# Patient Record
Sex: Female | Born: 1985 | Race: Black or African American | Hispanic: No | Marital: Married | Smoking: Never smoker
Health system: Southern US, Community
[De-identification: ages and names within clinical notes are randomized; demographics above are authoritative.]

## PROBLEM LIST (undated history)

## (undated) ENCOUNTER — Inpatient Hospital Stay (HOSPITAL_COMMUNITY): Payer: Self-pay

## (undated) DIAGNOSIS — D649 Anemia, unspecified: Secondary | ICD-10-CM

## (undated) HISTORY — PX: TUBAL LIGATION: SHX77

## (undated) HISTORY — PX: APPENDECTOMY: SHX54

---

## 2010-11-11 ENCOUNTER — Emergency Department (HOSPITAL_COMMUNITY)
Admission: EM | Admit: 2010-11-11 | Discharge: 2010-11-11 | Payer: Self-pay | Source: Home / Self Care | Admitting: Emergency Medicine

## 2011-02-21 ENCOUNTER — Emergency Department (HOSPITAL_COMMUNITY)
Admission: EM | Admit: 2011-02-21 | Discharge: 2011-02-22 | Disposition: A | Discharge status: Home / Self Care | Payer: 59 | Attending: Emergency Medicine | Admitting: Emergency Medicine

## 2011-02-21 DIAGNOSIS — R51 Headache: Secondary | ICD-10-CM | POA: Insufficient documentation

## 2011-02-21 DIAGNOSIS — F0781 Postconcussional syndrome: Secondary | ICD-10-CM | POA: Insufficient documentation

## 2011-02-22 ENCOUNTER — Encounter (HOSPITAL_COMMUNITY): Payer: Self-pay | Admitting: Radiology

## 2011-02-22 ENCOUNTER — Emergency Department (HOSPITAL_COMMUNITY): Payer: 59

## 2011-06-02 ENCOUNTER — Other Ambulatory Visit (HOSPITAL_COMMUNITY)
Admission: RE | Admit: 2011-06-02 | Discharge: 2011-06-02 | Disposition: A | Discharge status: Home / Self Care | Payer: 59 | Source: Ambulatory Visit | Attending: Obstetrics and Gynecology | Admitting: Obstetrics and Gynecology

## 2011-06-02 DIAGNOSIS — Z01419 Encounter for gynecological examination (general) (routine) without abnormal findings: Secondary | ICD-10-CM | POA: Insufficient documentation

## 2011-06-02 DIAGNOSIS — Z113 Encounter for screening for infections with a predominantly sexual mode of transmission: Secondary | ICD-10-CM | POA: Insufficient documentation

## 2012-06-03 ENCOUNTER — Other Ambulatory Visit (HOSPITAL_COMMUNITY)
Admission: RE | Admit: 2012-06-03 | Discharge: 2012-06-03 | Disposition: A | Discharge status: Home / Self Care | Payer: 59 | Source: Ambulatory Visit | Attending: Obstetrics and Gynecology | Admitting: Obstetrics and Gynecology

## 2012-06-03 DIAGNOSIS — Z01419 Encounter for gynecological examination (general) (routine) without abnormal findings: Secondary | ICD-10-CM | POA: Insufficient documentation

## 2012-12-04 NOTE — L&D Delivery Note (Signed)
Delivery Note At 11:55 PM a viable female was delivered via Vaginal, Spontaneous Delivery (Presentation: Right Occiput Anterior).  APGAR: 8, 9; weight pending.   Placenta status: Intact, Spontaneous.  Cord: 3 vessels with the following complications: None.  Cord pH: n/a  Anesthesia: None  Episiotomy: Median, no extension Lacerations:  Suture Repair: 2.0 3.0 chromic Est. Blood Loss (mL): 350  Mom to postpartum.  Baby to skin to skin.  Geryl Rankins 09/03/2013, 12:51 AM

## 2013-01-17 IMAGING — CT CT HEAD W/O CM
1 of 2 series · 16 of 30 positions shown, 20 images · non-contrast
Comparison: None available

CLINICAL DATA: Fall.  Dizziness.  Pain behind eyes.

CT HEAD WITHOUT CONTRAST
TECHNIQUE: Contiguous axial images were obtained from the base of
the skull through the vertex without contrast.

[Series 4: headseq 2.4 h60s · axial · 0.43mm/px · z∈[-178,-55]mm · 16 of 60 slices shown, 20 images]
[im 4/60  brain]
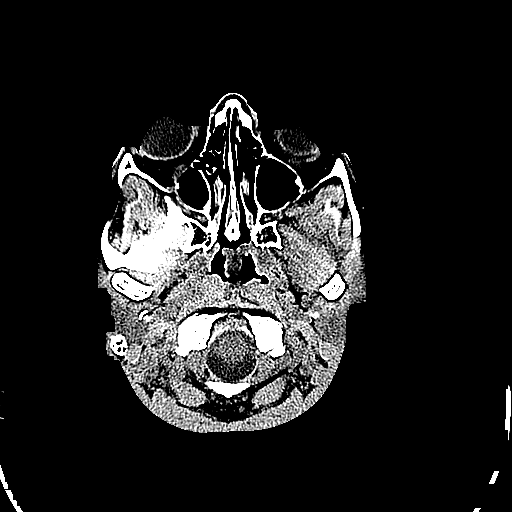
[im 4/60  bone]
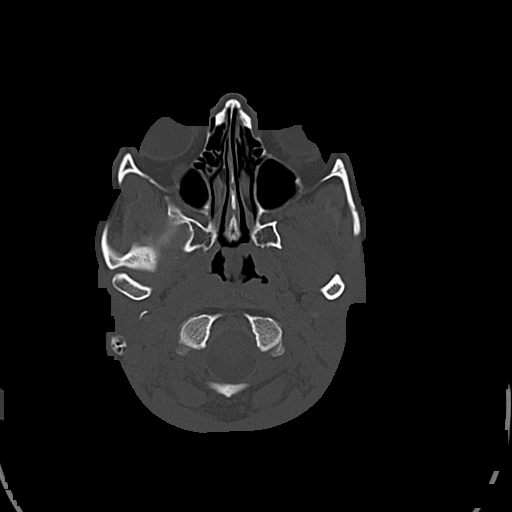
[im 7/60  brain]
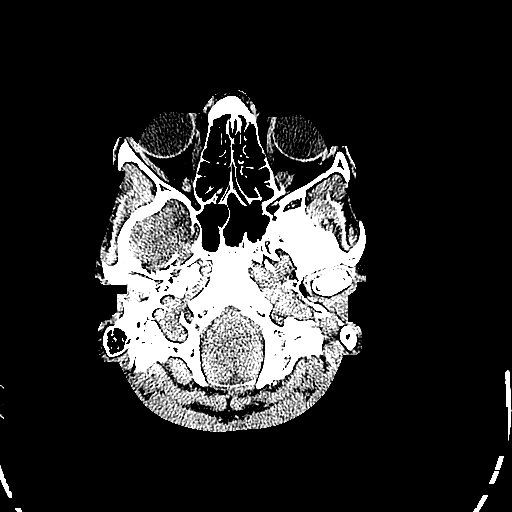
[im 10/60  brain]
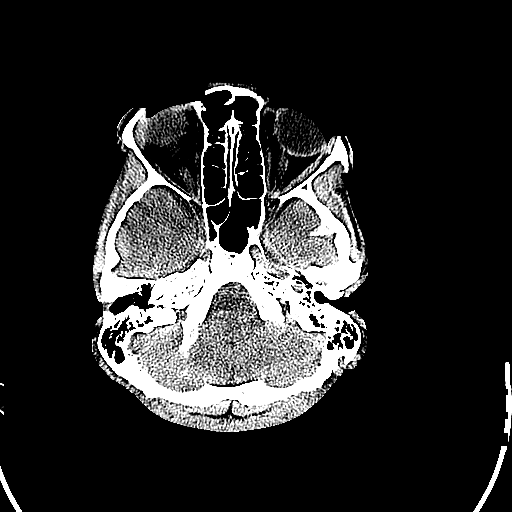
[im 13/60  brain]
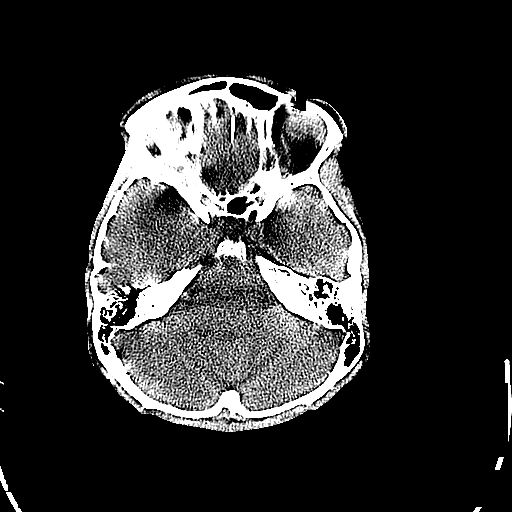
[im 19/60  brain]
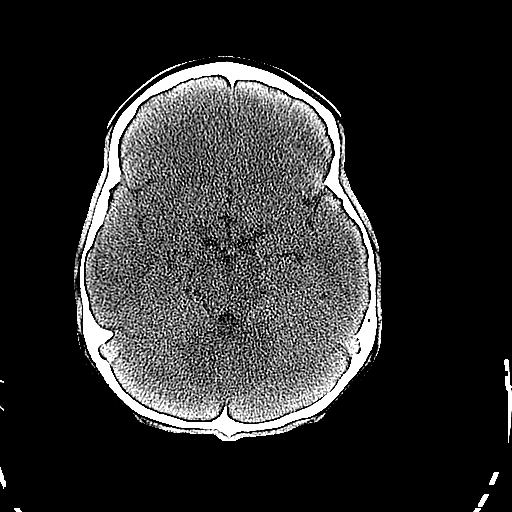
[im 19/60  bone]
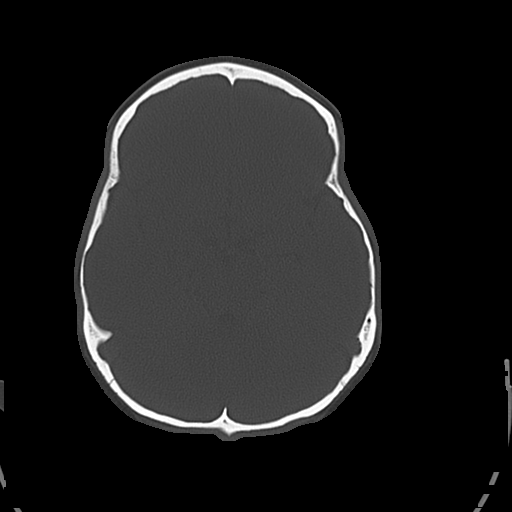
[im 22/60  brain]
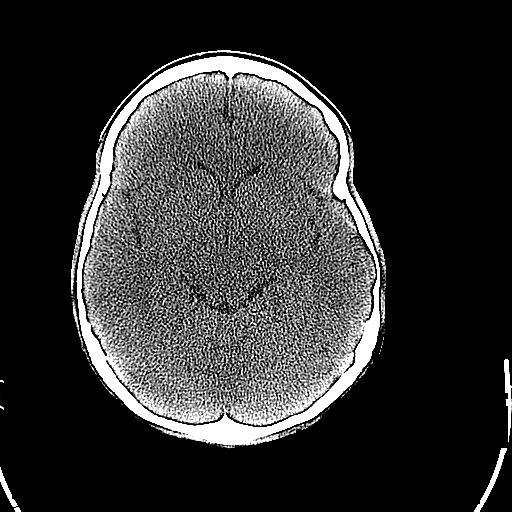
[im 25/60  brain]
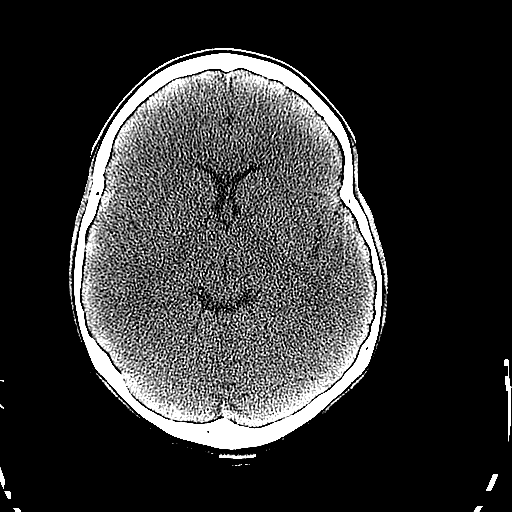
[im 28/60  brain]
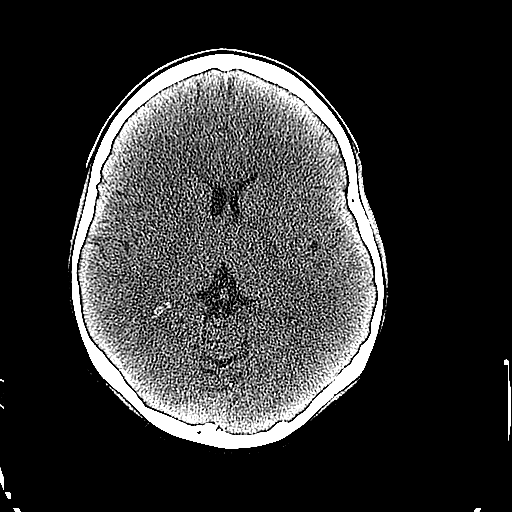
[im 32/60  brain]
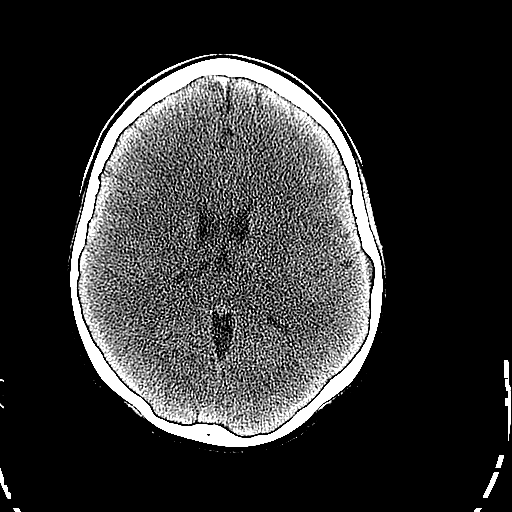
[im 32/60  bone]
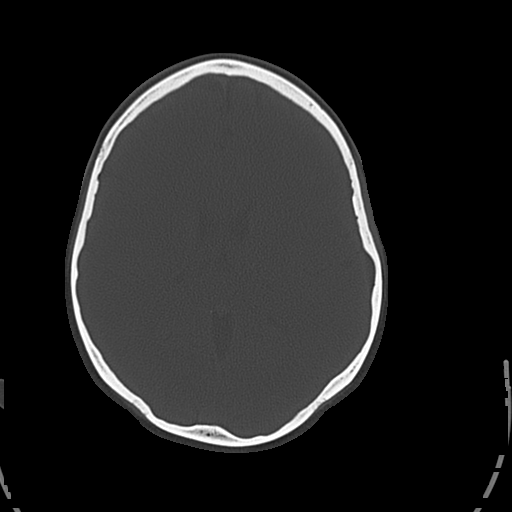
[im 35/60  brain]
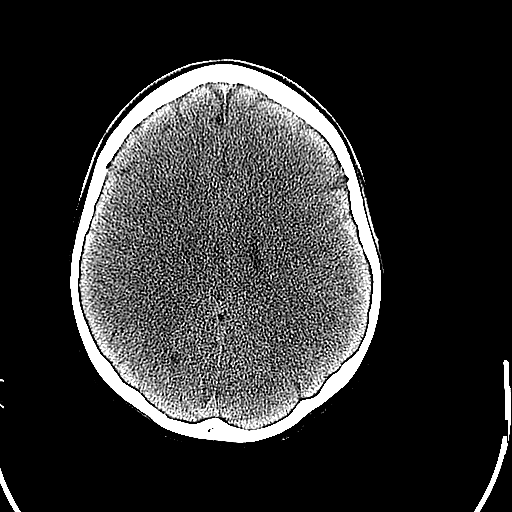
[im 38/60  brain]
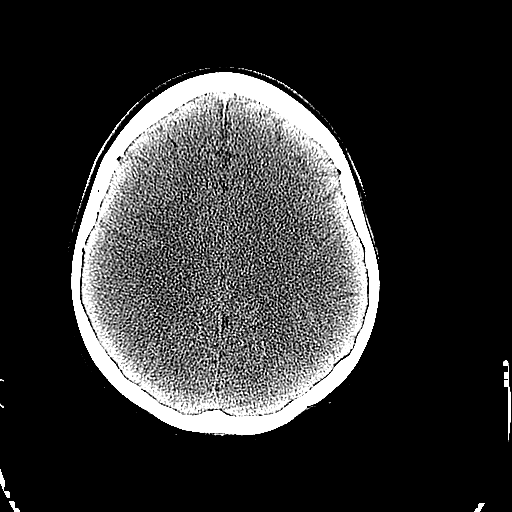
[im 41/60  brain]
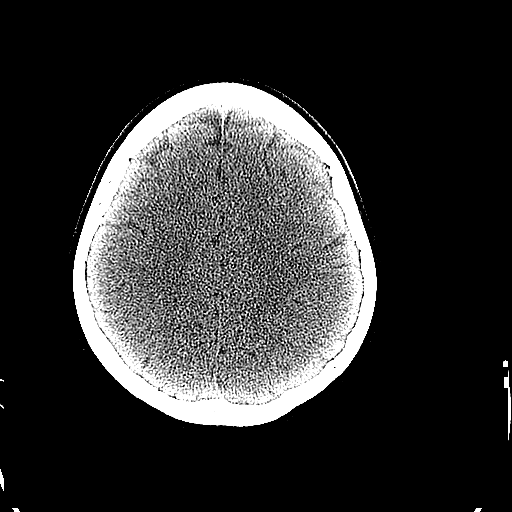
[im 47/60  brain]
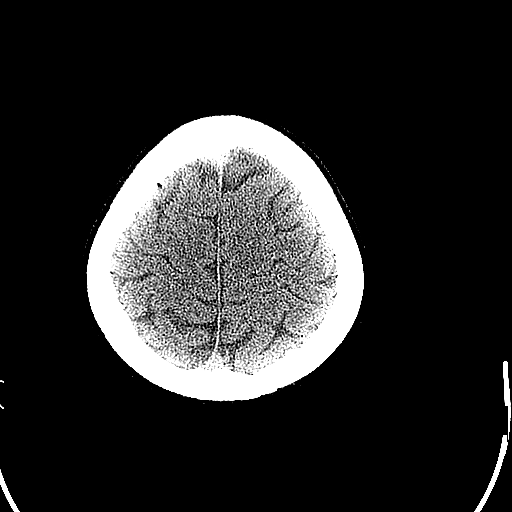
[im 47/60  bone]
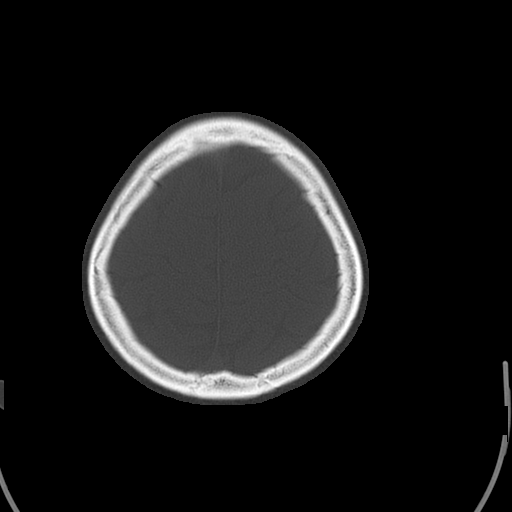
[im 50/60  brain]
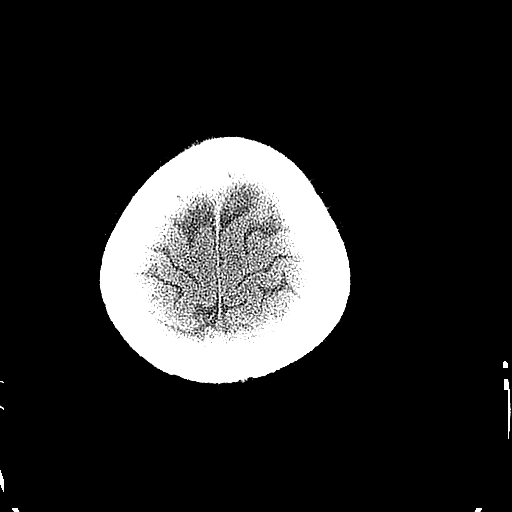
[im 53/60  brain]
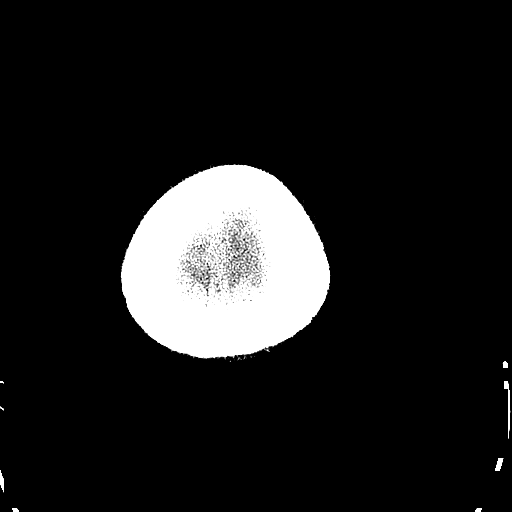
[im 56/60  brain]
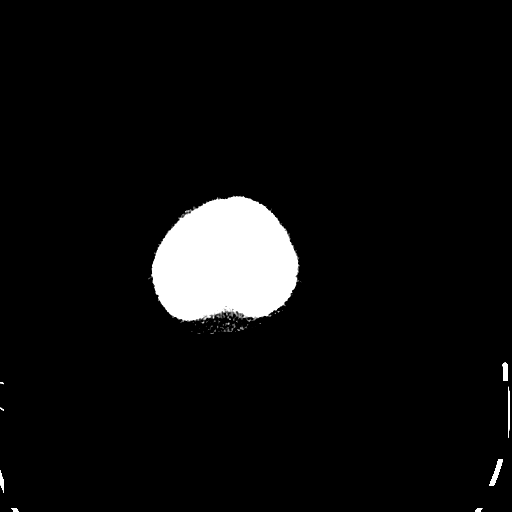

[16 of 30 positions shown; findings below may reference images not displayed]

FINDINGS: Bone windows demonstrate no significant soft tissue
swelling.  Clear paranasal sinuses and mastoid air cells.  No skull
fracture.

Soft tissue windows demonstrate no  mass lesion, hemorrhage,
hydrocephalus, acute infarct, intra-axial, or extra-axial fluid
collection.
IMPRESSION: Normal head CT.

## 2013-01-31 LAB — OB RESULTS CONSOLE HIV ANTIBODY (ROUTINE TESTING): HIV: NONREACTIVE

## 2013-01-31 LAB — OB RESULTS CONSOLE RUBELLA ANTIBODY, IGM: Rubella: IMMUNE

## 2013-01-31 LAB — OB RESULTS CONSOLE HEPATITIS B SURFACE ANTIGEN: Hepatitis B Surface Ag: NEGATIVE

## 2013-01-31 LAB — OB RESULTS CONSOLE ABO/RH: RH Type: POSITIVE

## 2013-01-31 LAB — OB RESULTS CONSOLE RPR: RPR: NONREACTIVE

## 2013-01-31 LAB — OB RESULTS CONSOLE ANTIBODY SCREEN: Antibody Screen: NEGATIVE

## 2013-02-06 LAB — OB RESULTS CONSOLE GC/CHLAMYDIA
Chlamydia: NEGATIVE
Gonorrhea: NEGATIVE

## 2013-08-07 LAB — OB RESULTS CONSOLE GBS: GBS: NEGATIVE

## 2013-09-02 ENCOUNTER — Inpatient Hospital Stay (HOSPITAL_COMMUNITY)
Admission: AD | Admit: 2013-09-02 | Discharge: 2013-09-04 | DRG: 775 | Disposition: A | Discharge status: Home / Self Care | Payer: Medicaid Other | Source: Ambulatory Visit | Attending: Obstetrics and Gynecology | Admitting: Obstetrics and Gynecology

## 2013-09-02 ENCOUNTER — Encounter (HOSPITAL_COMMUNITY): Payer: Self-pay | Admitting: *Deleted

## 2013-09-02 LAB — CBC
HCT: 41.3 % (ref 36.0–46.0)
Hemoglobin: 14.4 g/dL (ref 12.0–15.0)
MCH: 31.4 pg (ref 26.0–34.0)
MCHC: 34.9 g/dL (ref 30.0–36.0)
MCV: 90.2 fL (ref 78.0–100.0)
Platelets: 220 10*3/uL (ref 150–400)
RBC: 4.58 MIL/uL (ref 3.87–5.11)
RDW: 13 % (ref 11.5–15.5)
WBC: 17.1 10*3/uL — ABNORMAL HIGH (ref 4.0–10.5)

## 2013-09-02 MED ORDER — OXYCODONE-ACETAMINOPHEN 5-325 MG PO TABS: 1.0000 | ORAL_TABLET | ORAL | Status: DC | PRN

## 2013-09-02 MED ORDER — OXYTOCIN BOLUS FROM INFUSION
500.0000 mL | INTRAVENOUS | Status: DC
  Administered 2013-09-03: 500 mL via INTRAVENOUS

## 2013-09-02 MED ORDER — CITRIC ACID-SODIUM CITRATE 334-500 MG/5ML PO SOLN: 30.0000 mL | ORAL | Status: DC | PRN

## 2013-09-02 MED ORDER — ONDANSETRON HCL 4 MG/2ML IJ SOLN: 4.0000 mg | Freq: Four times a day (QID) | INTRAMUSCULAR | Status: DC | PRN

## 2013-09-02 MED ORDER — OXYTOCIN 40 UNITS IN LACTATED RINGERS INFUSION - SIMPLE MED
62.5000 mL/h | INTRAVENOUS | Status: DC
  Filled 2013-09-02: qty 1000

## 2013-09-02 MED ORDER — LIDOCAINE HCL (PF) 1 % IJ SOLN
30.0000 mL | INTRAMUSCULAR | Status: DC | PRN
  Administered 2013-09-03: 30 mL via SUBCUTANEOUS
  Filled 2013-09-02 (×2): qty 30

## 2013-09-02 MED ORDER — IBUPROFEN 600 MG PO TABS: 600.0000 mg | ORAL_TABLET | Freq: Four times a day (QID) | ORAL | Status: DC | PRN

## 2013-09-02 MED ORDER — ACETAMINOPHEN 325 MG PO TABS: 650.0000 mg | ORAL_TABLET | ORAL | Status: DC | PRN

## 2013-09-02 MED ORDER — LACTATED RINGERS IV SOLN: INTRAVENOUS | Status: DC

## 2013-09-02 MED ORDER — FLEET ENEMA 7-19 GM/118ML RE ENEM: 1.0000 | ENEMA | RECTAL | Status: DC | PRN

## 2013-09-02 MED ORDER — LACTATED RINGERS IV SOLN: 500.0000 mL | INTRAVENOUS | Status: DC | PRN

## 2013-09-02 NOTE — MAU Note (Signed)
Contractions, denies bleeding or ROM 

## 2013-09-02 NOTE — MAU Note (Signed)
Pt states contractions have become more intense since last night when they started

## 2013-09-03 ENCOUNTER — Encounter (HOSPITAL_COMMUNITY): Payer: Self-pay | Admitting: General Practice

## 2013-09-03 LAB — RPR: RPR Ser Ql: NONREACTIVE

## 2013-09-03 LAB — CBC
HCT: 38.6 % (ref 36.0–46.0)
Hemoglobin: 13.4 g/dL (ref 12.0–15.0)
MCH: 31 pg (ref 26.0–34.0)
MCHC: 34.7 g/dL (ref 30.0–36.0)
MCV: 89.4 fL (ref 78.0–100.0)
Platelets: 208 10*3/uL (ref 150–400)
RBC: 4.32 MIL/uL (ref 3.87–5.11)
RDW: 12.7 % (ref 11.5–15.5)
WBC: 22.5 10*3/uL — ABNORMAL HIGH (ref 4.0–10.5)

## 2013-09-03 LAB — ABO/RH: ABO/RH(D): O POS

## 2013-09-03 MED ORDER — MAGNESIUM HYDROXIDE 400 MG/5ML PO SUSP: 30.0000 mL | ORAL | Status: DC | PRN

## 2013-09-03 MED ORDER — LANOLIN HYDROUS EX OINT: TOPICAL_OINTMENT | CUTANEOUS | Status: DC | PRN

## 2013-09-03 MED ORDER — IBUPROFEN 600 MG PO TABS
600.0000 mg | ORAL_TABLET | Freq: Four times a day (QID) | ORAL | Status: DC
  Administered 2013-09-03 – 2013-09-04 (×5): 600 mg via ORAL
  Filled 2013-09-03 (×5): qty 1

## 2013-09-03 MED ORDER — METHYLERGONOVINE MALEATE 0.2 MG/ML IJ SOLN: 0.2000 mg | INTRAMUSCULAR | Status: DC | PRN

## 2013-09-03 MED ORDER — DIPHENHYDRAMINE HCL 25 MG PO CAPS: 25.0000 mg | ORAL_CAPSULE | Freq: Four times a day (QID) | ORAL | Status: DC | PRN

## 2013-09-03 MED ORDER — BENZOCAINE-MENTHOL 20-0.5 % EX AERO
1.0000 "application " | INHALATION_SPRAY | CUTANEOUS | Status: DC | PRN
  Administered 2013-09-03: 1 via TOPICAL
  Filled 2013-09-03: qty 56

## 2013-09-03 MED ORDER — ONDANSETRON HCL 4 MG PO TABS: 4.0000 mg | ORAL_TABLET | ORAL | Status: DC | PRN

## 2013-09-03 MED ORDER — METHYLERGONOVINE MALEATE 0.2 MG PO TABS: 0.2000 mg | ORAL_TABLET | ORAL | Status: DC | PRN

## 2013-09-03 MED ORDER — ZOLPIDEM TARTRATE 5 MG PO TABS: 5.0000 mg | ORAL_TABLET | Freq: Every evening | ORAL | Status: DC | PRN

## 2013-09-03 MED ORDER — SIMETHICONE 80 MG PO CHEW: 80.0000 mg | CHEWABLE_TABLET | ORAL | Status: DC | PRN

## 2013-09-03 MED ORDER — DIBUCAINE 1 % RE OINT: 1.0000 "application " | TOPICAL_OINTMENT | RECTAL | Status: DC | PRN

## 2013-09-03 MED ORDER — OXYTOCIN 40 UNITS IN LACTATED RINGERS INFUSION - SIMPLE MED: 62.5000 mL/h | INTRAVENOUS | Status: DC | PRN

## 2013-09-03 MED ORDER — SENNOSIDES-DOCUSATE SODIUM 8.6-50 MG PO TABS
2.0000 | ORAL_TABLET | ORAL | Status: DC
  Administered 2013-09-04: 2 via ORAL

## 2013-09-03 MED ORDER — PRENATAL MULTIVITAMIN CH
1.0000 | ORAL_TABLET | Freq: Every day | ORAL | Status: DC
  Administered 2013-09-03: 1 via ORAL
  Filled 2013-09-03: qty 1

## 2013-09-03 MED ORDER — TETANUS-DIPHTH-ACELL PERTUSSIS 5-2.5-18.5 LF-MCG/0.5 IM SUSP: 0.5000 mL | Freq: Once | INTRAMUSCULAR | Status: DC

## 2013-09-03 MED ORDER — OXYCODONE-ACETAMINOPHEN 5-325 MG PO TABS: 1.0000 | ORAL_TABLET | ORAL | Status: DC | PRN

## 2013-09-03 MED ORDER — INFLUENZA VAC SPLIT QUAD 0.5 ML IM SUSP
0.5000 mL | INTRAMUSCULAR | Status: AC
  Administered 2013-09-04: 0.5 mL via INTRAMUSCULAR
  Filled 2013-09-03: qty 0.5

## 2013-09-03 MED ORDER — MEASLES, MUMPS & RUBELLA VAC ~~LOC~~ INJ
0.5000 mL | INJECTION | Freq: Once | SUBCUTANEOUS | Status: DC
  Filled 2013-09-03: qty 0.5

## 2013-09-03 MED ORDER — ONDANSETRON HCL 4 MG/2ML IJ SOLN: 4.0000 mg | INTRAMUSCULAR | Status: DC | PRN

## 2013-09-03 MED ORDER — WITCH HAZEL-GLYCERIN EX PADS: 1.0000 "application " | MEDICATED_PAD | CUTANEOUS | Status: DC | PRN

## 2013-09-03 NOTE — Progress Notes (Signed)
UR chart review completed.  

## 2013-09-03 NOTE — Progress Notes (Signed)
Post Partum Day 1 s/p vaginal delivery  Subjective: no complaints, up ad lib, voiding and tolerating PO  Objective: Blood pressure 110/68, pulse 94, temperature 98.9 F (37.2 C), temperature source Oral, resp. rate 18, height 5' (1.524 m), weight 63.05 kg (139 lb), SpO2 97.00%, unknown if currently breastfeeding.  Physical Exam:  General: alert and cooperative Lochia: appropriate Uterine Fundus: firm Incision: NA DVT Evaluation: No evidence of DVT seen on physical exam.   Recent Labs  09/02/13 2230 09/03/13 0605  HGB 14.4 13.4  HCT 41.3 38.6    Assessment/Plan: Plan for discharge tomorrow and Breastfeeding   LOS: 1 day   Katrina [name] J. 09/03/2013, 7:38 PM

## 2013-09-03 NOTE — H&P (Signed)
Katrina [name] is a 27 y.o. female G1 at 68 1/7 weeks presenting for contractions. Contractions become every 5 minutes and very intense ~ 2-3 hours prior to admission.  Denies LOF, vaginal bleeding.  Per RN, pt had SROM after presenting to L&D. Prenatal care was  initiated at 10 weeks with Dr. Gerald [name].  It has been uncomplicated.  Maternal Medical History:  Reason for admission: Contractions.   Contractions: Onset was 13-24 hours ago.   Frequency: regular.   Perceived severity is strong.    Fetal activity: Perceived fetal activity is normal.    Prenatal complications: no prenatal complications Prenatal Complications - Diabetes: none.    OB History   Grav Para Term Preterm Abortions TAB SAB Ect Mult Living   1 1 1       1      Past Medical History  Diagnosis Date  . Medical history non-contributory    Past Surgical History  Procedure Laterality Date  . Appendectomy     Family History: family history includes Cancer in her maternal grandfather. There is no history of Alcohol abuse, Arthritis, Asthma, Birth defects, Depression, COPD, Diabetes, Drug abuse, Early death, Hearing loss, Heart disease, Hyperlipidemia, Hypertension, Kidney disease, Learning disabilities, Mental illness, Mental retardation, Miscarriages / Stillbirths, Stroke, or Vision loss. Social History:  reports that she has never smoked. She does not have any smokeless tobacco history on file. She reports that she does not drink alcohol or use illicit drugs.   Review of Systems  Gastrointestinal: Positive for abdominal pain.    Dilation: Lip/rim Effacement (%): 100 Station: +1 Exam by:: Katrina [name] pressure 110/68, pulse 94, temperature 98.9 F (37.2 C), temperature source Oral, resp. rate 18, height 5' (1.524 m), weight 63.05 kg (139 lb), SpO2 97.00%, unknown if currently breastfeeding. Maternal Exam:  Uterine Assessment: Contraction strength is firm.  Contraction frequency is regular.   Abdomen:  Estimated fetal weight is 6 lbs.   Fetal presentation: vertex  Introitus: Normal vulva. Amniotic fluid character: clear.  Pelvis: adequate for delivery.   Cervix: Cervix evaluated by digital exam.     Fetal Exam Fetal Monitor Review: Mode: fetoscope.   Variability: moderate (6-25 bpm).   Pattern: variable decelerations.    Fetal State Assessment: Category II - tracings are indeterminate.     Physical Exam  Constitutional: She is oriented to person, place, and time. She appears well-developed and well-nourished. She appears distressed.  HENT:  Head: Normocephalic and atraumatic.  Eyes: EOM are normal.  Neck: Normal range of motion.  Respiratory: She is in respiratory distress.  GI: There is no tenderness.  Genitourinary: Vagina normal.  Musculoskeletal: Normal range of motion.  Neurological: She is alert and oriented to person, place, and time.  Skin: Skin is warm and dry.  Psychiatric: She has a normal mood and affect.    Prenatal labs: ABO, Rh: --/--/O POS (09/30 2230) Antibody: Negative (02/28 0000) Rubella: Immune (02/28 0000) RPR: NON REACTIVE (09/30 2230)  HBsAg: Negative (02/28 0000)  HIV: Non-reactive (02/28 0000)  GBS: Negative (09/04 0000)   Assessment/Plan: IUP at 40+ weeks, active labor, s/p SROM Anticipate vaginal delivery soon. Labor support without epidural or pain meds.  Katrina [name] 09/03/2013, 9:41 PM

## 2013-09-04 MED ORDER — IBUPROFEN 600 MG PO TABS: 600.0000 mg | ORAL_TABLET | Freq: Four times a day (QID) | ORAL | Status: DC | PRN

## 2013-09-04 NOTE — Discharge Summary (Signed)
Obstetric Discharge Summary Reason for Admission: onset of labor Prenatal Procedures: none Intrapartum Procedures: episiotomy mediolateral  Postpartum Procedures: none Complications-Operative and Postpartum: none Hemoglobin  Date Value Range Status  09/03/2013 13.4  12.0 - 15.0 g/dL Final     HCT  Date Value Range Status  09/03/2013 38.6  36.0 - 46.0 % Final    Physical Exam:  General: alert and cooperative Lochia: appropriate Uterine Fundus: firm Incision: NA DVT Evaluation: No evidence of DVT seen on physical exam.  Discharge Diagnoses: Term Pregnancy-delivered  Discharge Information: Date: 09/04/2013 Activity: pelvic rest Diet: routine Medications: PNV and Ibuprofen Condition: stable Instructions: refer to practice specific booklet Discharge to: home Follow-up Information   Follow up with Jessee Avers., MD. Schedule an appointment as soon as possible for a visit in 6 weeks.   Specialty:  Obstetrics and Gynecology   Contact information:   [address] [place] [address] [phone]       Newborn Data: Live born female  Birth Weight: 7 lb 2.2 oz (3238 g) APGAR: 8, 9  Home with mother.  Verlyn Lambert J. 09/04/2013, 8:03 AM

## 2013-09-09 ENCOUNTER — Inpatient Hospital Stay (HOSPITAL_COMMUNITY): Admission: RE | Admit: 2013-09-09 | Payer: 59 | Source: Ambulatory Visit

## 2013-11-24 ENCOUNTER — Other Ambulatory Visit: Payer: Self-pay | Admitting: Obstetrics and Gynecology

## 2013-11-24 ENCOUNTER — Other Ambulatory Visit (HOSPITAL_COMMUNITY)
Admission: RE | Admit: 2013-11-24 | Discharge: 2013-11-24 | Disposition: A | Discharge status: Home / Self Care | Payer: BC Managed Care – PPO | Source: Ambulatory Visit | Attending: Obstetrics and Gynecology | Admitting: Obstetrics and Gynecology

## 2013-11-24 DIAGNOSIS — Z01419 Encounter for gynecological examination (general) (routine) without abnormal findings: Secondary | ICD-10-CM | POA: Insufficient documentation

## 2014-09-09 LAB — OB RESULTS CONSOLE HIV ANTIBODY (ROUTINE TESTING): HIV: NONREACTIVE

## 2014-09-09 LAB — OB RESULTS CONSOLE GC/CHLAMYDIA
Chlamydia: NEGATIVE
Gonorrhea: NEGATIVE

## 2014-09-09 LAB — OB RESULTS CONSOLE ABO/RH: RH Type: POSITIVE

## 2014-09-09 LAB — OB RESULTS CONSOLE HEPATITIS B SURFACE ANTIGEN: Hepatitis B Surface Ag: NEGATIVE

## 2014-09-09 LAB — OB RESULTS CONSOLE ANTIBODY SCREEN: Antibody Screen: NEGATIVE

## 2014-09-09 LAB — OB RESULTS CONSOLE RUBELLA ANTIBODY, IGM: Rubella: IMMUNE

## 2014-09-09 LAB — OB RESULTS CONSOLE RPR: RPR: NONREACTIVE

## 2014-10-05 ENCOUNTER — Encounter (HOSPITAL_COMMUNITY): Payer: Self-pay | Admitting: General Practice

## 2014-12-04 NOTE — L&D Delivery Note (Addendum)
Delivery Note At 7:39 AM a viable female, "Katrina [name], Jr.", was delivered via Vaginal, Spontaneous Delivery (Presentation: ; Occiput Anterior).  APGAR: , ; weight  .   Placenta status: Intact, Spontaneous.  Cord:  with the following complications: None.  Cord pH: NA  Anesthesia: None  Episiotomy: None Lacerations:  None Suture Repair: NA Est. Blood Loss (mL):  250  Mom to postpartum.  Baby to Couplet care / Skin to Skin. Family plans inpatient circumcision.  Will inform [facility] MDs of request. Per consult with Dr. Estanislado Pandyivard, will defer circumcision to Dr. Richardson Doppole on Monday.  [name] 03/20/2015, 8:01 AM

## 2015-03-20 ENCOUNTER — Inpatient Hospital Stay (HOSPITAL_COMMUNITY)
Admission: AD | Admit: 2015-03-20 | Discharge: 2015-03-22 | DRG: 775 | Disposition: A | Discharge status: Home / Self Care | Payer: Medicaid Other | Source: Ambulatory Visit | Attending: Obstetrics and Gynecology | Admitting: Obstetrics and Gynecology

## 2015-03-20 ENCOUNTER — Telehealth: Payer: Self-pay

## 2015-03-20 ENCOUNTER — Encounter (HOSPITAL_COMMUNITY): Payer: Self-pay

## 2015-03-20 DIAGNOSIS — O4292 Full-term premature rupture of membranes, unspecified as to length of time between rupture and onset of labor: Secondary | ICD-10-CM | POA: Diagnosis present

## 2015-03-20 DIAGNOSIS — Z3A38 38 weeks gestation of pregnancy: Secondary | ICD-10-CM | POA: Diagnosis present

## 2015-03-20 LAB — CBC
HCT: 34.2 % — ABNORMAL LOW (ref 36.0–46.0)
Hemoglobin: 11.7 g/dL — ABNORMAL LOW (ref 12.0–15.0)
MCH: 29.9 pg (ref 26.0–34.0)
MCHC: 34.2 g/dL (ref 30.0–36.0)
MCV: 87.5 fL (ref 78.0–100.0)
Platelets: 158 10*3/uL (ref 150–400)
RBC: 3.91 MIL/uL (ref 3.87–5.11)
RDW: 12.9 % (ref 11.5–15.5)
WBC: 9.6 10*3/uL (ref 4.0–10.5)

## 2015-03-20 LAB — TYPE AND SCREEN
ABO/RH(D): O POS
Antibody Screen: NEGATIVE

## 2015-03-20 LAB — RPR: RPR Ser Ql: NONREACTIVE

## 2015-03-20 LAB — AMNISURE RUPTURE OF MEMBRANE (ROM) NOT AT ARMC: Amnisure ROM: POSITIVE

## 2015-03-20 LAB — HIV ANTIBODY (ROUTINE TESTING W REFLEX): HIV Screen 4th Generation wRfx: NONREACTIVE

## 2015-03-20 MED ORDER — BENZOCAINE-MENTHOL 20-0.5 % EX AERO
1.0000 "application " | INHALATION_SPRAY | CUTANEOUS | Status: DC | PRN
  Filled 2015-03-20: qty 56

## 2015-03-20 MED ORDER — ACETAMINOPHEN 325 MG PO TABS: 650.0000 mg | ORAL_TABLET | ORAL | Status: DC | PRN

## 2015-03-20 MED ORDER — OXYTOCIN 40 UNITS IN LACTATED RINGERS INFUSION - SIMPLE MED
62.5000 mL/h | INTRAVENOUS | Status: DC
  Filled 2015-03-20: qty 1000

## 2015-03-20 MED ORDER — NALBUPHINE HCL 10 MG/ML IJ SOLN
10.0000 mg | INTRAMUSCULAR | Status: DC | PRN
  Administered 2015-03-20: 10 mg via INTRAVENOUS
  Filled 2015-03-20: qty 1

## 2015-03-20 MED ORDER — OXYCODONE-ACETAMINOPHEN 5-325 MG PO TABS: 2.0000 | ORAL_TABLET | ORAL | Status: DC | PRN

## 2015-03-20 MED ORDER — LANOLIN HYDROUS EX OINT: TOPICAL_OINTMENT | CUTANEOUS | Status: DC | PRN

## 2015-03-20 MED ORDER — NALBUPHINE HCL 10 MG/ML IJ SOLN: 10.0000 mg | Freq: Once | INTRAMUSCULAR | Status: DC

## 2015-03-20 MED ORDER — CITRIC ACID-SODIUM CITRATE 334-500 MG/5ML PO SOLN: 30.0000 mL | ORAL | Status: DC | PRN

## 2015-03-20 MED ORDER — SODIUM CHLORIDE 0.9 % IJ SOLN: 3.0000 mL | INTRAMUSCULAR | Status: DC | PRN

## 2015-03-20 MED ORDER — TETANUS-DIPHTH-ACELL PERTUSSIS 5-2.5-18.5 LF-MCG/0.5 IM SUSP: 0.5000 mL | Freq: Once | INTRAMUSCULAR | Status: DC

## 2015-03-20 MED ORDER — SENNOSIDES-DOCUSATE SODIUM 8.6-50 MG PO TABS
2.0000 | ORAL_TABLET | ORAL | Status: DC
Start: 2015-03-21 — End: 2015-03-22
  Administered 2015-03-20: 2 via ORAL
  Filled 2015-03-20 (×2): qty 2

## 2015-03-20 MED ORDER — OXYCODONE-ACETAMINOPHEN 5-325 MG PO TABS: 1.0000 | ORAL_TABLET | ORAL | Status: DC | PRN

## 2015-03-20 MED ORDER — WITCH HAZEL-GLYCERIN EX PADS: 1.0000 "application " | MEDICATED_PAD | CUTANEOUS | Status: DC | PRN

## 2015-03-20 MED ORDER — LACTATED RINGERS IV SOLN: 500.0000 mL | INTRAVENOUS | Status: DC | PRN

## 2015-03-20 MED ORDER — DIBUCAINE 1 % RE OINT
1.0000 "application " | TOPICAL_OINTMENT | RECTAL | Status: DC | PRN
  Filled 2015-03-20: qty 28

## 2015-03-20 MED ORDER — LIDOCAINE HCL (PF) 1 % IJ SOLN
30.0000 mL | INTRAMUSCULAR | Status: DC | PRN
  Filled 2015-03-20: qty 30

## 2015-03-20 MED ORDER — ONDANSETRON HCL 4 MG PO TABS: 4.0000 mg | ORAL_TABLET | ORAL | Status: DC | PRN

## 2015-03-20 MED ORDER — ONDANSETRON HCL 4 MG/2ML IJ SOLN: 4.0000 mg | Freq: Four times a day (QID) | INTRAMUSCULAR | Status: DC | PRN

## 2015-03-20 MED ORDER — ZOLPIDEM TARTRATE 5 MG PO TABS
5.0000 mg | ORAL_TABLET | Freq: Every evening | ORAL | Status: DC | PRN
Start: 2015-03-20 — End: 2015-03-22

## 2015-03-20 MED ORDER — SODIUM CHLORIDE 0.9 % IV SOLN
250.0000 mL | INTRAVENOUS | Status: DC | PRN
Start: 2015-03-20 — End: 2015-03-20

## 2015-03-20 MED ORDER — DIPHENHYDRAMINE HCL 25 MG PO CAPS: 25.0000 mg | ORAL_CAPSULE | Freq: Four times a day (QID) | ORAL | Status: DC | PRN

## 2015-03-20 MED ORDER — ONDANSETRON HCL 4 MG/2ML IJ SOLN
4.0000 mg | INTRAMUSCULAR | Status: DC | PRN
Start: 2015-03-20 — End: 2015-03-22

## 2015-03-20 MED ORDER — IBUPROFEN 600 MG PO TABS
600.0000 mg | ORAL_TABLET | Freq: Four times a day (QID) | ORAL | Status: DC
  Administered 2015-03-20 – 2015-03-22 (×9): 600 mg via ORAL
  Filled 2015-03-20 (×9): qty 1

## 2015-03-20 MED ORDER — SIMETHICONE 80 MG PO CHEW: 80.0000 mg | CHEWABLE_TABLET | ORAL | Status: DC | PRN

## 2015-03-20 MED ORDER — PRENATAL MULTIVITAMIN CH
1.0000 | ORAL_TABLET | Freq: Every day | ORAL | Status: DC
  Administered 2015-03-20 – 2015-03-22 (×3): 1 via ORAL
  Filled 2015-03-20 (×3): qty 1

## 2015-03-20 MED ORDER — OXYTOCIN BOLUS FROM INFUSION
500.0000 mL | INTRAVENOUS | Status: DC
  Administered 2015-03-20: 500 mL via INTRAVENOUS

## 2015-03-20 NOTE — H&P (Signed)
Katrina [name] is a 29 y.o. female presenting for leakage of fluid and contractions.  Patient reports that she had gush of fluid this morning and then started having contractions.  Patient reports good fetal movement and no bloody show.  Patient is GBS negative and desires a non-medicated delivery.    Maternal Medical History:  Reason for admission: Rupture of membranes.   Contractions: Onset was 3-5 hours ago.   Frequency: regular.   Perceived severity is moderate.    Fetal activity: Perceived fetal activity is normal.   Last perceived fetal movement was within the past hour.    Prenatal complications: no prenatal complications Prenatal Complications - Diabetes: none.    OB History    Gravida Para Term Preterm AB TAB SAB Ectopic Multiple Living   2 1 1       1      Past Medical History  Diagnosis Date  . Medical history non-contributory    Past Surgical History  Procedure Laterality Date  . Appendectomy     Family History: family history includes Cancer in her maternal grandfather. There is no history of Alcohol abuse, Arthritis, Asthma, Birth defects, Depression, COPD, Diabetes, Drug abuse, Early death, Hearing loss, Heart disease, Hyperlipidemia, Hypertension, Kidney disease, Learning disabilities, Mental illness, Mental retardation, Miscarriages / Stillbirths, Stroke, or Vision loss. Social History:  reports that she has never smoked. She has never used smokeless tobacco. She reports that she does not drink alcohol or use illicit drugs.   Prenatal Transfer Tool  Maternal Diabetes: No Genetic Screening: Declined Maternal Ultrasounds/Referrals: Normal Fetal Ultrasounds or other Referrals:  None Maternal Substance Abuse:  No Significant Maternal Medications:  None Significant Maternal Lab Results:  Lab values include: Group B Strep negative Other Comments:  None  Review of Systems  All other systems reviewed and are negative.   Dilation: 4 Effacement (%):  90 Station: 0 Exam by:: Katrina [name], rnc Blood pressure 113/72, pulse 93, temperature 98.4 F (36.9 C), temperature source Oral, resp. rate 20, unknown if currently breastfeeding. Maternal Exam:  Uterine Assessment: Contraction strength is moderate.  Abdomen: Patient reports no abdominal tenderness. Fundal height is Appropriate Gestational Age.   Estimated fetal weight is 6 1/4-6 3/4lbs.   Fetal presentation: vertex  Introitus: Amniotic fluid character: clear.  Pelvis: adequate for delivery.   Cervix: Cervix evaluated by digital exam.     Fetal Exam Fetal Monitor Review: Mode: ultrasound.   Baseline rate: 125.  Variability: moderate (6-25 bpm).   Pattern: accelerations present and no decelerations.    Fetal State Assessment: Category I - tracings are normal.     Physical Exam  Constitutional: She is oriented to person, place, and time. She appears well-developed and well-nourished.  HENT:  Head: Normocephalic and atraumatic.  Eyes: EOM are normal.  Neck: Normal range of motion.  Cardiovascular: Normal rate.   Respiratory: Effort normal.  GI: Soft.  Musculoskeletal: Normal range of motion.  Neurological: She is alert and oriented to person, place, and time.  Skin: Skin is warm and dry.    Prenatal labs: ABO, Rh:  O Positive Antibody:  Negative Rubella:   Immune RPR:   NR HBsAg:   Negative HIV:   Nonreactive GBS:   Negative  Assessment IUP at 38.6wks Cat I FT PROM Active Labor H/O Precipitous Labor  [facility] with Dr. Kathie RhodesS. Rivard Routine Labor and Delivery Orders per [facility] Protocol Discussed POC to include saline lock, intermittent monitoring, and ambulation if desired  Discussed delivery will be by this or oncoming midwife No questions or concerns  Anton Cheramie LYNN MSN, CNM 03/20/2015, 5:22 AM

## 2015-03-20 NOTE — MAU Note (Signed)
Report to Annabelle HarmanDana, Consulting civil engineercharge RN in birthing suites.  Pt to go to room 166.

## 2015-03-20 NOTE — Lactation Note (Signed)
This note was copied from the chart of Katrina Cassi Geremia. Lactation Consultation Note Initial visit at 9 hours of age.  Mom reports 2 good feedings and denies pain.  Mom further denies any concerns about breastfeeding at this time.  Mom is holding baby STS after bath and just finished a feeding.  Mom attempted hand expression with pinching nipple.  Demonstrated more affective method and colostrum was still not visible at this time.  Encouraged mom to continue trying with feedings. [facility] resources given and discussed.  Encouraged to feed with early cues on demand.  Early newborn behavior discussed.   Mom to call for assist as needed.    Patient Name: Katrina [name] [id number]'s Date: 03/20/2015 Reason for consult: Initial assessment   Maternal Data Has patient been taught Hand Expression?: Yes Does the patient have breastfeeding experience prior to this delivery?: Yes  Feeding Feeding Type: Breast Fed Length of feed: 15 min  LATCH Score/Interventions Latch: Repeated attempts needed to sustain latch, nipple held in mouth throughout feeding, stimulation needed to elicit sucking reflex. Intervention(s): Assist with latch;Adjust position  Audible Swallowing: None Intervention(s): Skin to skin  Type of Nipple: Everted at rest and after stimulation  Comfort (Breast/Nipple): Soft / non-tender     Hold (Positioning): Assistance needed to correctly position infant at breast and maintain latch.  LATCH Score: 6  Lactation Tools Discussed/Used     Consult Status Consult Status: Follow-up Date: 03/21/15 Follow-up type: In-patient    Antwon Rochin, Arvella MerlesJana Lynn 03/20/2015, 5:10 PM

## 2015-03-20 NOTE — Progress Notes (Signed)
Patient feels nauseated, given ginger ale and saltine crackers.

## 2015-03-20 NOTE — MAU Note (Signed)
Water broke at 3 am.  Clear fluid.  Baby moving well. No bleeding. 2 cm at office.  Contractions every 4-5 min  Since 3am.

## 2015-03-20 NOTE — Telephone Encounter (Signed)
Patient call reporting that she had big gush of fluid.  States she is currently wearing a pad and it is saturated.  Patient reports history of fast labors.  Patient reports contractions have gotten "intense."  Instructed to report to MAU for evaluation.

## 2015-03-21 LAB — CBC
HCT: 32.3 % — ABNORMAL LOW (ref 36.0–46.0)
Hemoglobin: 10.8 g/dL — ABNORMAL LOW (ref 12.0–15.0)
MCH: 29.6 pg (ref 26.0–34.0)
MCHC: 33.4 g/dL (ref 30.0–36.0)
MCV: 88.5 fL (ref 78.0–100.0)
Platelets: 142 10*3/uL — ABNORMAL LOW (ref 150–400)
RBC: 3.65 MIL/uL — ABNORMAL LOW (ref 3.87–5.11)
RDW: 13 % (ref 11.5–15.5)
WBC: 12.3 10*3/uL — ABNORMAL HIGH (ref 4.0–10.5)

## 2015-03-21 NOTE — Progress Notes (Signed)
Katrina [name]   Subjective: Post Partum Day 1 Vaginal delivery, no laceration Patient up ad lib, denies syncope or dizziness. Reports consuming regular diet without issues and denies N/V No issues with urination and reports bleeding is appropriate  Feeding:  breast Contraceptive plan:   unsure  Objective: Temp:  [98 F (36.7 C)-98.7 F (37.1 C)] 98.1 F (36.7 C) (04/17 0554) Pulse Rate:  [64-75] 68 (04/17 0554) Resp:  [18] 18 (04/17 0554) BP: (104-126)/(59-85) 104/60 mmHg (04/17 0554)  Physical Exam:  General: alert and cooperative Ext: WNL, no edema. No evidence of DVT seen on physical exam. Breast: Soft filling Lungs: CTAB Heart RRR without murmur  Abdomen:  Soft, fundus firm, lochia scant, + bowel sounds, non distended, non tender Lochia: appropriate Uterine Fundus: firm Laceration: none    Recent Labs  03/20/15 0525 03/21/15 0557  HGB 11.7* 10.8*  HCT 34.2* 32.3*    Assessment S/P Vaginal Delivery-Day 1 Stable  Normal [employer] Breastfeeding Circumcision: in patient  Plan: Continue current care Plan for discharge tomorrow, Breastfeeding and Lactation consult Lactation support   Hilda Rynders, CNM, MSN 03/21/2015, 8:00 AM

## 2015-03-21 NOTE — Lactation Note (Signed)
This note was copied from the chart of Katrina [name]. Lactation Consultation Note: Baby now 1928 hours old and just had first void. Had stooled earlier. Mom concerned that she is unable to hand express Colostrum. Baby just finished feeding and is asleep skin to skin with mom. Reviewed that baby was best at getting milk from the best. Discussed pumping but decided frequent feedings would be better. Reports she feels tugging and nipples are a little tender. No questions at present.. To call for assist prn  Patient Name: Katrina Maris BergerChretian Chenette [id number]'s Date: 03/21/2015 Reason for consult: Follow-up assessment   Maternal Data Formula Feeding for Exclusion: No Has patient been taught Hand Expression?: Yes Does the patient have breastfeeding experience prior to this delivery?: Yes  Feeding Feeding Type: Breast Fed Length of feed: 30 min  LATCH Score/Interventions Latch: Grasps breast easily, tongue down, lips flanged, rhythmical sucking. Intervention(s): Adjust position  Audible Swallowing: None Intervention(s): Hand expression  Type of Nipple: Everted at rest and after stimulation  Comfort (Breast/Nipple): Soft / non-tender     Hold (Positioning): No assistance needed to correctly position infant at breast.  LATCH Score: 8  Lactation Tools Discussed/Used     Consult Status Consult Status: Follow-up Date: 03/22/15 Follow-up type: In-patient    Pamelia HoitWeeks, Nimra Puccinelli D 03/21/2015, 12:23 PM

## 2015-03-22 MED ORDER — IBUPROFEN 600 MG PO TABS: 600.0000 mg | ORAL_TABLET | Freq: Four times a day (QID) | ORAL | Status: DC | PRN

## 2015-03-22 NOTE — Discharge Summary (Signed)
Obstetric Discharge Summary Reason for Admission: onset of labor Prenatal Procedures: none Intrapartum Procedures: spontaneous vaginal delivery Postpartum Procedures: none Complications-Operative and Postpartum: none HEMOGLOBIN  Date Value Ref Range Status  03/21/2015 10.8* 12.0 - 15.0 g/dL Final   HCT  Date Value Ref Range Status  03/21/2015 32.3* 36.0 - 46.0 % Final    Physical Exam:  General: alert and cooperative Lochia: appropriate Uterine Fundus: firm Incision: NA DVT Evaluation: No evidence of DVT seen on physical exam.  Discharge Diagnoses: Term Pregnancy-delivered  Discharge Information: Date: 03/22/2015 Activity: pelvic rest Diet: routine Medications: PNV and Ibuprofen Condition: stable Instructions: refer to practice specific booklet Discharge to: home Follow-up Information    Follow up with Jessee AversOLE,Carlinda Ohlson J., MD. Schedule an appointment as soon as possible for a visit in 6 weeks.   Specialty:  Obstetrics and Gynecology   Why:  postpartum visit   Contact information:   [address] [place] [address] [phone]       Follow up with Jessee AversOLE,Adellyn Capek J., MD. Schedule an appointment as soon as possible for a visit in 6 weeks.   Specialty:  Obstetrics and Gynecology   Why:  postpartum visit    Contact information:   [address] [place] [address] [phone]       Newborn Data: Live born female  Birth Weight: 7 lb 12 oz (3515 g) APGAR: 9, 9  Home with mother.  Katrina [name] J. 03/22/2015, 8:44 AM

## 2015-03-22 NOTE — Progress Notes (Signed)
UR chart review completed.  

## 2015-03-22 NOTE — Lactation Note (Signed)
This note was copied from the chart of Katrina [name]. Lactation Consultation Note  Patient Name: Katrina Maris BergerChretian Nunes PIRJJ'OToday's Date: 03/22/2015   Visited with Mom and FOB on day of discharge when baby 5152 hrs old.  Baby in car seat sleeping.  Baby has been sleepy since circumcision this am, and Mom plans to feed him when she gets home.  Mom has been breast feeding baby frequently, lasting 10-20 minutes with latch scores of 9.  Mom describes manually expressing colostrum now, hearing swallows.  Pediatrician appointment tomorrow.  Only one stool since birth, multiple voids.  Mom reminded of OP lactation services available and encouraged to call for any questions.  Engorgement prevention and treatment discussed.    Judee ClaraSmith, Taylor Levick E 03/22/2015, 12:28 PM

## 2015-11-08 ENCOUNTER — Other Ambulatory Visit: Payer: Self-pay | Admitting: Obstetrics and Gynecology

## 2015-11-08 ENCOUNTER — Other Ambulatory Visit (HOSPITAL_COMMUNITY)
Admission: RE | Admit: 2015-11-08 | Discharge: 2015-11-08 | Disposition: A | Discharge status: Home / Self Care | Payer: Medicaid Other | Source: Ambulatory Visit | Attending: Obstetrics and Gynecology | Admitting: Obstetrics and Gynecology

## 2015-11-08 DIAGNOSIS — Z01419 Encounter for gynecological examination (general) (routine) without abnormal findings: Secondary | ICD-10-CM | POA: Insufficient documentation

## 2015-11-09 LAB — CYTOLOGY - PAP

## 2017-01-09 ENCOUNTER — Other Ambulatory Visit (HOSPITAL_COMMUNITY)
Admission: RE | Admit: 2017-01-09 | Discharge: 2017-01-09 | Disposition: A | Discharge status: Home / Self Care | Payer: Medicaid Other | Source: Ambulatory Visit | Attending: Obstetrics and Gynecology | Admitting: Obstetrics and Gynecology

## 2017-01-09 ENCOUNTER — Other Ambulatory Visit: Payer: Self-pay | Admitting: Obstetrics and Gynecology

## 2017-01-09 DIAGNOSIS — Z01419 Encounter for gynecological examination (general) (routine) without abnormal findings: Secondary | ICD-10-CM | POA: Insufficient documentation

## 2017-01-09 DIAGNOSIS — Z1151 Encounter for screening for human papillomavirus (HPV): Secondary | ICD-10-CM | POA: Insufficient documentation

## 2017-01-12 LAB — CYTOLOGY - PAP
Diagnosis: NEGATIVE
HPV: NOT DETECTED

## 2017-07-05 ENCOUNTER — Encounter (HOSPITAL_COMMUNITY): Payer: Self-pay | Admitting: Emergency Medicine

## 2017-07-05 ENCOUNTER — Emergency Department (HOSPITAL_COMMUNITY)
Admission: EM | Admit: 2017-07-05 | Discharge: 2017-07-05 | Disposition: A | Discharge status: Home / Self Care | Payer: Medicaid Other | Attending: Emergency Medicine | Admitting: Emergency Medicine

## 2017-07-05 DIAGNOSIS — J3489 Other specified disorders of nose and nasal sinuses: Secondary | ICD-10-CM | POA: Insufficient documentation

## 2017-07-05 DIAGNOSIS — K0889 Other specified disorders of teeth and supporting structures: Secondary | ICD-10-CM

## 2017-07-05 MED ORDER — CLINDAMYCIN HCL 150 MG PO CAPS: 450.0000 mg | ORAL_CAPSULE | Freq: Three times a day (TID) | ORAL | 0 refills | Status: AC

## 2017-07-05 NOTE — Discharge Instructions (Signed)
Follow up with your Dentist and ENT as needed.

## 2017-07-05 NOTE — ED Provider Notes (Signed)
[facility]-EMERGENCY DEPT Provider Note   CSN: [id number] Arrival date & time: 07/05/17  0908     History   Chief Complaint Chief Complaint  Patient presents with  . Nose pain    HPI Katrina [name] is a 31 y.o. female.  31 yo F with a chief complaint of right nose pain. This been going on for the past couple days. She states that there is been a bump inside of her nose for the past year or so. That is the area she describes tender. On the floor of the right naris. Denies fevers or chills. Denies cough or congestion. She states she has some dental cavities but denies pain along that area of herr teeth.   The history is provided by the patient.  Illness  This is a chronic problem. The current episode started more than 1 week ago. The problem occurs constantly. The problem has not changed since onset.Pertinent negatives include no chest pain, no headaches and no shortness of breath. Nothing aggravates the symptoms. Nothing relieves the symptoms. She has tried nothing for the symptoms. The treatment provided no relief.    Past Medical History:  Diagnosis Date  . Medical history non-contributory     Patient Active Problem List   Diagnosis Date Noted  . Vaginal delivery 03/20/2015    Past Surgical History:  Procedure Laterality Date  . APPENDECTOMY      OB History    Gravida Para Term Preterm AB Living   2 2 2     2    SAB TAB Ectopic Multiple Live Births         0 2       Home Medications    Prior to Admission medications   Medication Sig Start Date End Date Taking? Authorizing Provider  clindamycin (CLEOCIN) 150 MG capsule Take 3 capsules (450 mg total) by mouth 3 (three) times daily. X 7 days 07/05/17 07/12/17  Melene PlanFloyd, Chuckie Mccathern, DO  ibuprofen (ADVIL,MOTRIN) 600 MG tablet Take 1 tablet (600 mg total) by mouth every 6 (six) hours as needed. 03/22/15   Gerald Leitzole, Tara, MD  Prenatal Vit-Fe Fumarate-FA (PRENATAL MULTIVITAMIN) TABS tablet Take 1 tablet by mouth daily at 12 noon.     [provider]    Family History Family History  Problem Relation Age of Onset  . Cancer Maternal Grandfather   . Alcohol abuse Neg Hx   . Arthritis Neg Hx   . Asthma Neg Hx   . Birth defects Neg Hx   . Depression Neg Hx   . COPD Neg Hx   . Diabetes Neg Hx   . Drug abuse Neg Hx   . Early death Neg Hx   . Hearing loss Neg Hx   . Heart disease Neg Hx   . Hyperlipidemia Neg Hx   . Hypertension Neg Hx   . Kidney disease Neg Hx   . Learning disabilities Neg Hx   . Mental illness Neg Hx   . Mental retardation Neg Hx   . Miscarriages / Stillbirths Neg Hx   . Stroke Neg Hx   . Vision loss Neg Hx     Social History Social History  Substance Use Topics  . Smoking status: Never Smoker  . Smokeless tobacco: Never Used  . Alcohol use No     Allergies   Patient has no known allergies.   Review of Systems Review of Systems  Constitutional: Negative for chills and fever.  HENT: Negative for congestion and rhinorrhea.  Nose pain  Eyes: Negative for redness and visual disturbance.  Respiratory: Negative for shortness of breath and wheezing.   Cardiovascular: Negative for chest pain and palpitations.  Gastrointestinal: Negative for nausea and vomiting.  Genitourinary: Negative for dysuria and urgency.  Musculoskeletal: Negative for arthralgias and myalgias.  Skin: Negative for pallor and wound.  Neurological: Negative for dizziness and headaches.     Physical Exam Updated Vital Signs BP 107/73 (BP Location: Left Arm)   Pulse 72   Temp 98.9 F (37.2 C) (Oral)   Resp 16   Ht 5' (1.524 m)   Wt 53.1 kg (117 lb)   LMP 06/06/2017   SpO2 97%   BMI 22.85 kg/m   Physical Exam  Constitutional: She is oriented to person, place, and time. She appears well-developed and well-nourished. No distress.  HENT:  Head: Normocephalic and atraumatic.  Mild elevation of the floor of the naris on the right compared to the left. There is no erythema no drainage no  fluctuance. Pain with palpation to the superior aspect of the right lateral incisor at its attachment to the gum. No pain with percussion of the tooth.  Eyes: Pupils are equal, round, and reactive to light. EOM are normal.  Neck: Normal range of motion. Neck supple.  Cardiovascular: Normal rate and regular rhythm.  Exam reveals no gallop and no friction rub.   No murmur heard. Pulmonary/Chest: Effort normal. She has no wheezes. She has no rales.  Abdominal: Soft. She exhibits no distension. There is no tenderness.  Musculoskeletal: She exhibits no edema or tenderness.  Neurological: She is alert and oriented to person, place, and time.  Skin: Skin is warm and dry. She is not diaphoretic.  Psychiatric: She has a normal mood and affect. Her behavior is normal.  Nursing note and vitals reviewed.    ED Treatments / Results  Labs (all labs ordered are listed, but only abnormal results are displayed) Labs Reviewed - No data to display  EKG  EKG Interpretation None       Radiology No results found.  Procedures Procedures (including critical care time)  Medications Ordered in ED Medications - No data to display   Initial Impression / Assessment and Plan / ED Course  I have reviewed the triage vital signs and the nursing notes.  Pertinent labs & imaging results that were available during my care of the patient were reviewed by me and considered in my medical decision making (see chart for details).     30 yoF With a chief complaint of right naris pain. The patient has been complaining of a bump to that area for over a year. She does have some pain that extends from her mouth to that area. I'm unsure if this is a primary dental complaint though it would be unlikely for her to have a lesion in her nose for over a year and be dental in etiology. I will start her on clindamycin for possible apical dental abscess. She is an appointment with her dentist in the next week or so. Given  follow-up with ENT for further evaluation of a bump in her nose.  9:48 AM:  I have discussed the diagnosis/risks/treatment options with the patient and believe the pt to be eligible for discharge home to follow-up with Dentist, ENT. We also discussed returning to the ED immediately if new or worsening sx occur. We discussed the sx which are most concerning (e.g., sudden worsening pain, fever, inability to tolerate by mouth) that necessitate  immediate return. Medications administered to the patient during their visit and any new prescriptions provided to the patient are listed below.  Medications given during this visit Medications - No data to display   The patient appears reasonably screen and/or stabilized for discharge and I doubt any other medical condition or other [facility] requiring further screening, evaluation, or treatment in the ED at this time prior to discharge.    Final Clinical Impressions(s) / ED Diagnoses   Final diagnoses:  Nasal pain  Pain, dental    New Prescriptions New Prescriptions   CLINDAMYCIN (CLEOCIN) 150 MG CAPSULE    Take 3 capsules (450 mg total) by mouth 3 (three) times daily. X 7 days     Melene Plan, [place] 07/05/17 [phone]

## 2017-07-05 NOTE — ED Triage Notes (Signed)
Pt reports a bump inside her R nostril that has been there over a year, has been very sore the past 4 days, denies drainage.

## 2017-08-03 ENCOUNTER — Institutional Professional Consult (permissible substitution): Payer: Medicaid Other | Admitting: Physician Assistant

## 2017-08-31 ENCOUNTER — Encounter: Payer: Self-pay | Admitting: Physician Assistant

## 2017-08-31 ENCOUNTER — Ambulatory Visit (INDEPENDENT_AMBULATORY_CARE_PROVIDER_SITE_OTHER): Payer: BLUE CROSS/BLUE SHIELD | Admitting: Physician Assistant

## 2017-08-31 VITALS — BP 118/78 | HR 97 | Ht 60.0 in | Wt 104.0 lb

## 2017-08-31 DIAGNOSIS — G43709 Chronic migraine without aura, not intractable, without status migrainosus: Secondary | ICD-10-CM | POA: Diagnosis not present

## 2017-08-31 MED ORDER — CYCLOBENZAPRINE HCL 10 MG PO TABS: 10.0000 mg | ORAL_TABLET | Freq: Three times a day (TID) | ORAL | 1 refills | Status: DC | PRN

## 2017-08-31 MED ORDER — PROMETHAZINE HCL 12.5 MG PO TABS: 12.5000 mg | ORAL_TABLET | Freq: Four times a day (QID) | ORAL | 1 refills | Status: DC | PRN

## 2017-08-31 NOTE — Patient Instructions (Signed)
Migraine Headache A migraine headache is an intense, throbbing pain on one side or both sides of the head. Migraines may also cause other symptoms, such as nausea, vomiting, and sensitivity to light and noise. What are the causes? Doing or taking certain things may also trigger migraines, such as:  Alcohol.  Smoking.  Medicines, such as: ? Medicine used to treat chest pain (nitroglycerine). ? Birth control pills. ? Estrogen pills. ? Certain blood pressure medicines.  Aged cheeses, chocolate, or caffeine.  Foods or drinks that contain nitrates, glutamate, aspartame, or tyramine.  Physical activity.  Other things that may trigger a migraine include:  Menstruation.  Pregnancy.  Hunger.  Stress, lack of sleep, too much sleep, or fatigue.  Weather changes.  What increases the risk? The following factors may make you more likely to experience migraine headaches:  Age. Risk increases with age.  Family history of migraine headaches.  Being Caucasian.  Depression and anxiety.  Obesity.  Being a woman.  Having a hole in the heart (patent foramen ovale) or other heart problems.  What are the signs or symptoms? The main symptom of this condition is pulsating or throbbing pain. Pain may:  Happen in any area of the head, such as on one side or both sides.  Interfere with daily activities.  Get worse with physical activity.  Get worse with exposure to bright lights or loud noises.  Other symptoms may include:  Nausea.  Vomiting.  Dizziness.  General sensitivity to bright lights, loud noises, or smells.  Before you get a migraine, you may get warning signs that a migraine is developing (aura). An aura may include:  Seeing flashing lights or having blind spots.  Seeing bright spots, halos, or zigzag lines.  Having tunnel vision or blurred vision.  Having numbness or a tingling feeling.  Having trouble talking.  Having muscle weakness.  How is this  diagnosed? A migraine headache can be diagnosed based on:  Your symptoms.  A physical exam.  Tests, such as CT scan or MRI of the head. These imaging tests can help rule out other causes of headaches.  Taking fluid from the spine (lumbar puncture) and analyzing it (cerebrospinal fluid analysis, or CSF analysis).  How is this treated? A migraine headache is usually treated with medicines that:  Relieve pain.  Relieve nausea.  Prevent migraines from coming back.  Treatment may also include:  Acupuncture.  Lifestyle changes like avoiding foods that trigger migraines.  Follow these instructions at home: Medicines  Take over-the-counter and prescription medicines only as told by your health care provider.  Do not drive or use heavy machinery while taking prescription pain medicine.  To prevent or treat constipation while you are taking prescription pain medicine, your health care provider may recommend that you: ? Drink enough fluid to keep your urine clear or pale yellow. ? Take over-the-counter or prescription medicines. ? Eat foods that are high in fiber, such as fresh fruits and vegetables, whole grains, and beans. ? Limit foods that are high in fat and processed sugars, such as fried and sweet foods. Lifestyle  Avoid alcohol use.  Do not use any products that contain nicotine or tobacco, such as cigarettes and e-cigarettes. If you need help quitting, ask your health care provider.  Get at least 8 hours of sleep every night.  Limit your stress. General instructions   Keep a journal to find out what may trigger your migraine headaches. For example, write down: ? What you eat and   drink. ? How much sleep you get. ? Any change to your diet or medicines.  If you have a migraine: ? Avoid things that make your symptoms worse, such as bright lights. ? It may help to lie down in a dark, quiet room. ? Do not drive or use heavy machinery. ? Ask your health care provider  what activities are safe for you while you are experiencing symptoms.  Keep all follow-up visits as told by your health care provider. This is important. Contact a health care provider if:  You develop symptoms that are different or more severe than your usual migraine symptoms. Get help right away if:  Your migraine becomes severe.  You have a fever.  You have a stiff neck.  You have vision loss.  Your muscles feel weak or like you cannot control them.  You start to lose your balance often.  You develop trouble walking.  You faint. This information is not intended to replace advice given to you by your health care provider. Make sure you discuss any questions you have with your health care provider. Document Released: 11/20/2005 Document Revised: 06/09/2016 Document Reviewed: 05/08/2016 Elsevier Interactive Patient Education  2017 [employer]   
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
## 2017-09-02 DIAGNOSIS — G43709 Chronic migraine without aura, not intractable, without status migrainosus: Secondary | ICD-10-CM | POA: Insufficient documentation

## 2017-11-02 ENCOUNTER — Encounter: Payer: BLUE CROSS/BLUE SHIELD | Admitting: Physician Assistant

## 2017-11-02 ENCOUNTER — Encounter: Payer: Self-pay | Admitting: Physician Assistant

## 2017-11-02 ENCOUNTER — Ambulatory Visit (INDEPENDENT_AMBULATORY_CARE_PROVIDER_SITE_OTHER): Payer: BLUE CROSS/BLUE SHIELD | Admitting: Physician Assistant

## 2017-11-02 VITALS — BP 110/72 | HR 58 | Ht 60.0 in | Wt 101.0 lb

## 2017-11-02 DIAGNOSIS — G43709 Chronic migraine without aura, not intractable, without status migrainosus: Secondary | ICD-10-CM

## 2017-11-02 NOTE — Patient Instructions (Signed)

## 2017-11-02 NOTE — Progress Notes (Signed)
History:  Katrina [name] is a 31 y.o. [id number] who presents to clinic today for migraine headache.  She is not seeing any improvement in her headaches.  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  She has a headache presently 5/10.  She has been using the flexeril and although she sleeps well on it, she has not seen improvement in her headaches with this medication.  She is significantly stressed at home.  She does not take time for self care.    HIT6:68 Number of days in the last 4 weeks with:  Severe headache: 8 Moderate headache: 5 Mild headache: 5  No headache: 10   Past Medical History:  Diagnosis Date  . Medical history non-contributory     Social History   Socioeconomic History  . Marital status: Married    Spouse name: Not on file  . Number of children: Not on file  . Years of education: Not on file  . Highest education level: Not on file  Social Needs  . Financial resource strain: Not on file  . Food insecurity - worry: Not on file  . Food insecurity - inability: Not on file  . Transportation needs - medical: Not on file  . Transportation needs - non-medical: Not on file  Occupational History  . Not on file  Tobacco Use  . Smoking status: Never Smoker  . Smokeless tobacco: Never Used  Substance and Sexual Activity  . Alcohol use: No  . Drug use: No  . Sexual activity: Not Currently    Birth control/protection: None  Other Topics Concern  . Not on file  Social History Narrative  . Not on file    Family History  Problem Relation Age of Onset  . Cancer Maternal Grandfather   . Alcohol abuse Neg Hx   . Arthritis Neg Hx   . Asthma Neg Hx   . Birth defects Neg Hx   . Depression Neg Hx   . COPD Neg Hx   . Diabetes Neg Hx   . Drug abuse Neg Hx   . Early death Neg Hx   . Hearing loss Neg Hx   . Heart disease Neg Hx   . Hyperlipidemia Neg Hx   . Hypertension Neg Hx   . Kidney disease Neg Hx   . Learning disabilities Neg Hx   .  Mental illness Neg Hx   . Mental retardation Neg Hx   . Miscarriages / Stillbirths Neg Hx   . Stroke Neg Hx   . Vision loss Neg Hx     No Known Allergies  Current Outpatient Medications on File Prior to Visit  Medication Sig Dispense Refill  . ASPIRIN 81 PO   0  . cyclobenzaprine (FLEXERIL) 10 MG tablet Take 1 tablet (10 mg total) by mouth every 8 (eight) hours as needed for muscle spasms. 30 tablet 1  . estradiol valerate (DELESTROGEN) 20 MG/ML injection   0  . ibuprofen (ADVIL,MOTRIN) 600 MG tablet Take 1 tablet (600 mg total) by mouth every 6 (six) hours as needed. 30 tablet 1  . Prenatal Vit-Fe Fumarate-FA (PRENATAL MULTIVITAMIN) TABS tablet Take 1 tablet by mouth daily at 12 noon.    . promethazine (PHENERGAN) 12.5 MG tablet Take 1 tablet (12.5 mg total) by mouth every 6 (six) hours as needed for nausea or vomiting. 30 tablet 1   No current facility-administered medications on file prior to visit.      Review of Systems:  All  pertinent positive/negative included in HPI, all other review of systems are negative   Objective:  Physical Exam BP 110/72   Pulse (!) 58   Ht 5' (1.524 m)   Wt 101 lb (45.8 kg)   LMP 10/29/2017   BMI 19.73 kg/m  CONSTITUTIONAL: Well-developed, well-nourished female in no acute distress.  EYES: EOM intact ENT: Normocephalic CARDIOVASCULAR: Regular rate   RESPIRATORY: Normal rate. MUSCULOSKELETAL: Normal ROM SKIN: Warm, dry without erythema  NEUROLOGICAL: Alert, oriented, CN II-XII grossly intact, Appropriate balance PSYCH: Normal behavior, mood   Assessment & Plan:  Assessment: 1. Chronic migraine without aura without status migrainosus, not intractable    No improvement  Plan: IM Toradol 60mg  today in office Continue with flexeril, tylenol for acute HA.  Encouraged to try Biofreeze (or similar) Encouraged to try phenergan when needed (did not try earlier) Make self care a priority.  Exercise.   Follow-up PRN.  Pt expecting  pregnancy mid-December.  Her HAs usually improve at that time.    Bertram Denvereague Clark, Labib Cwynar E, PA-C 11/02/2017 12:34 PM

## 2017-12-04 NOTE — L&D Delivery Note (Signed)
Delivery Note At 2:59 PM a viable female was delivered via Vaginal, Spontaneous (Presentation: direct OA).  APGAR: 9, 9; weight pending.   Placenta status: delivered spontaneously and completely after 9 minutes. blood loss largely attributed to slower delivery of placenta. Fundus was firm and bleeding minimal after delivery of placenta. Cord: three vessel with the following complications: none.   Anesthesia:  None Episiotomy: None Lacerations:  Periclitoral Suture Repair: 3.0 vicryl Est. Blood Loss (mL): 650  Mom to postpartum.  Baby to Couplet care / Skin to Skin. 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   Janeece Riggers 08/10/2018, 3:41 PM

## 2018-01-22 DIAGNOSIS — B349 Viral infection, unspecified: Secondary | ICD-10-CM | POA: Diagnosis not present

## 2018-01-28 DIAGNOSIS — Z3201 Encounter for pregnancy test, result positive: Secondary | ICD-10-CM | POA: Diagnosis not present

## 2018-01-28 DIAGNOSIS — Z3482 Encounter for supervision of other normal pregnancy, second trimester: Secondary | ICD-10-CM | POA: Diagnosis not present

## 2018-01-28 DIAGNOSIS — Z3481 Encounter for supervision of other normal pregnancy, first trimester: Secondary | ICD-10-CM | POA: Diagnosis not present

## 2018-02-15 DIAGNOSIS — Z3482 Encounter for supervision of other normal pregnancy, second trimester: Secondary | ICD-10-CM | POA: Diagnosis not present

## 2018-04-16 DIAGNOSIS — R5382 Chronic fatigue, unspecified: Secondary | ICD-10-CM | POA: Diagnosis not present

## 2018-05-16 DIAGNOSIS — Z3482 Encounter for supervision of other normal pregnancy, second trimester: Secondary | ICD-10-CM | POA: Diagnosis not present

## 2018-05-18 ENCOUNTER — Encounter (HOSPITAL_COMMUNITY): Payer: Self-pay | Admitting: *Deleted

## 2018-05-18 ENCOUNTER — Inpatient Hospital Stay (HOSPITAL_COMMUNITY)
Admission: AD | Admit: 2018-05-18 | Discharge: 2018-05-19 | Disposition: A | Discharge status: Home / Self Care | Payer: BLUE CROSS/BLUE SHIELD | Source: Ambulatory Visit | Attending: Obstetrics and Gynecology | Admitting: Obstetrics and Gynecology

## 2018-05-18 ENCOUNTER — Inpatient Hospital Stay (HOSPITAL_COMMUNITY): Payer: BLUE CROSS/BLUE SHIELD

## 2018-05-18 DIAGNOSIS — O26833 Pregnancy related renal disease, third trimester: Secondary | ICD-10-CM | POA: Diagnosis not present

## 2018-05-18 DIAGNOSIS — N200 Calculus of kidney: Secondary | ICD-10-CM | POA: Insufficient documentation

## 2018-05-18 DIAGNOSIS — M545 Low back pain, unspecified: Secondary | ICD-10-CM

## 2018-05-18 DIAGNOSIS — N132 Hydronephrosis with renal and ureteral calculous obstruction: Secondary | ICD-10-CM | POA: Diagnosis not present

## 2018-05-18 DIAGNOSIS — Z3A28 28 weeks gestation of pregnancy: Secondary | ICD-10-CM | POA: Diagnosis not present

## 2018-05-18 DIAGNOSIS — R3129 Other microscopic hematuria: Secondary | ICD-10-CM

## 2018-05-18 LAB — URINALYSIS, ROUTINE W REFLEX MICROSCOPIC
Bilirubin Urine: NEGATIVE
Glucose, UA: NEGATIVE mg/dL
Ketones, ur: NEGATIVE mg/dL
Nitrite: NEGATIVE
Protein, ur: NEGATIVE mg/dL
RBC / HPF: >50 RBC/hpf — ABNORMAL HIGH (ref 0–5)
Specific Gravity, Urine: 1.017 (ref 1.005–1.030)
pH: 6 (ref 5.0–8.0)

## 2018-05-18 LAB — CBC WITH DIFFERENTIAL/PLATELET
Basophils Absolute: 0.1 10*3/uL (ref 0.0–0.1)
Basophils Relative: 1 %
Eosinophils Absolute: 0.1 10*3/uL (ref 0.0–0.7)
Eosinophils Relative: 1 %
HCT: 30.1 % — ABNORMAL LOW (ref 36.0–46.0)
Hemoglobin: 10.3 g/dL — ABNORMAL LOW (ref 12.0–15.0)
Lymphocytes Relative: 23 %
Lymphs Abs: 2 10*3/uL (ref 0.7–4.0)
MCH: 28.9 pg (ref 26.0–34.0)
MCHC: 34.2 g/dL (ref 30.0–36.0)
MCV: 84.3 fL (ref 78.0–100.0)
Monocytes Absolute: 0.4 10*3/uL (ref 0.1–1.0)
Monocytes Relative: 4 %
Neutro Abs: 6.2 10*3/uL (ref 1.7–7.7)
Neutrophils Relative %: 71 %
Platelets: 208 10*3/uL (ref 150–400)
RBC: 3.57 MIL/uL — ABNORMAL LOW (ref 3.87–5.11)
RDW: 12.3 % (ref 11.5–15.5)
WBC: 8.7 10*3/uL (ref 4.0–10.5)

## 2018-05-18 NOTE — MAU Provider Note (Signed)
Chief Complaint:  Back Pain and Shortness of Breath (due to the pain in her back NO chest pain )   First Provider Initiated Contact with Patient 05/18/18 2120      HPI: Katrina [name] is a 32 y.o. G3P2002 at 61w2dwho presents to maternity admissions reporting intermittent left lower back pain and shortness of breath starting today.  She reports the back pain is intermittent, severe when it occurs lasting several minutes, then improving spontaneously.  It is associated with shortness of breath when the pain is happening. She denies shortness of breath in MAU.  She reports urinary frequency but no dysuria or urgency.  There are no other associated symptoms. She has not tried any treatments, including medication.  She denies any chest pain. She reports good fetal movement, denies LOF, vaginal bleeding, vaginal itching/burning, h/a, dizziness, n/v, or fever/chills.    HPI  Past Medical History: Past Medical History:  Diagnosis Date  . Medical history non-contributory     Past obstetric history: OB History  Gravida Para Term Preterm AB Living  3 2 2     2   SAB TAB Ectopic Multiple Live Births        0 2    # Outcome Date GA Lbr Len/2nd Weight Sex Delivery Anes PTL Lv  3 Current           2 Term 03/20/15 [redacted]w[redacted]d 04:22 / 00:17 7 lb 12 oz (3.515 kg) M Vag-Spont None  LIV  1 Term 09/02/13 [redacted]w[redacted]d 28:30 7 lb 2.2 oz (3.238 kg) F Vag-Spont None  LIV    Past Surgical History: Past Surgical History:  Procedure Laterality Date  . APPENDECTOMY      Family History: Family History  Problem Relation Age of Onset  . Cancer Maternal Grandfather   . Alcohol abuse Neg Hx   . Arthritis Neg Hx   . Asthma Neg Hx   . Birth defects Neg Hx   . Depression Neg Hx   . COPD Neg Hx   . Diabetes Neg Hx   . Drug abuse Neg Hx   . Early death Neg Hx   . Hearing loss Neg Hx   . Heart disease Neg Hx   . Hyperlipidemia Neg Hx   . Hypertension Neg Hx   . Kidney disease Neg Hx   . Learning disabilities  Neg Hx   . Mental illness Neg Hx   . Mental retardation Neg Hx   . Miscarriages / Stillbirths Neg Hx   . Stroke Neg Hx   . Vision loss Neg Hx     Social History: Social History   Tobacco Use  . Smoking status: Never Smoker  . Smokeless tobacco: Never Used  Substance Use Topics  . Alcohol use: No  . Drug use: No    Allergies: No Known Allergies  Meds:  Medications Prior to Admission  Medication Sig Dispense Refill Last Dose  . Prenatal Vit-Fe Fumarate-FA (PRENATAL MULTIVITAMIN) TABS tablet Take 1 tablet by mouth daily at 12 noon.   05/17/2018 at Unknown time  . ranitidine (ZANTAC) 150 MG capsule Take 150 mg by mouth 2 (two) times daily.   05/18/2018 at Unknown time  . ASPIRIN 81 PO   0 More than a month at Unknown time  . cyclobenzaprine (FLEXERIL) 10 MG tablet Take 1 tablet (10 mg total) by mouth every 8 (eight) hours as needed for muscle spasms. 30 tablet 1 More than a month at Unknown time  . estradiol valerate (DELESTROGEN) 20  MG/ML injection   0 More than a month at Unknown time  . ibuprofen (ADVIL,MOTRIN) 600 MG tablet Take 1 tablet (600 mg total) by mouth every 6 (six) hours as needed. 30 tablet 1 More than a month at Unknown time  . promethazine (PHENERGAN) 12.5 MG tablet Take 1 tablet (12.5 mg total) by mouth every 6 (six) hours as needed for nausea or vomiting. 30 tablet 1 More than a month at Unknown time    ROS:  Review of Systems  Constitutional: Negative for chills, fatigue and fever.  Respiratory: Negative for shortness of breath.   Cardiovascular: Negative for chest pain.  Gastrointestinal: Positive for abdominal pain.  Genitourinary: Positive for frequency. Negative for difficulty urinating, dysuria, flank pain, pelvic pain, vaginal bleeding, vaginal discharge and vaginal pain.  Musculoskeletal: Positive for back pain.  Neurological: Negative for dizziness and headaches.  Psychiatric/Behavioral: Negative.      I have reviewed patient's Past Medical Hx,  Surgical Hx, Family Hx, Social Hx, medications and allergies.   Physical Exam   Patient Vitals for the past 24 hrs:  BP Temp Temp src Pulse Resp  05/18/18 2110 (!) 111/52 98.2 F (36.8 C) Oral 86 18   Constitutional: Well-developed, well-nourished female in no acute distress.  HEART: normal rate, heart sounds, regular rhythm RESP: normal effort, lung sounds clear and equal bilaterally GI: Abd soft, non-tender, gravid appropriate for gestational age.  MS: Extremities nontender, no edema, normal ROM Neurologic: Alert and oriented x 4.  GU: Positive left sided CVAT, none on right  PELVIC EXAM: Cervix pink, visually closed, without lesion, scant white creamy discharge, vaginal walls and external genitalia normal Bimanual exam: Cervix 0/long/high, firm, anterior, neg CMT, uterus nontender, nonenlarged, adnexa without tenderness, enlargement, or mass     FHT:  Baseline 145 , moderate variability, accelerations present, no decelerations Contractions: None on toco or to palpation   Labs: Results for orders placed or performed during the hospital encounter of 05/18/18 (from the past 24 hour(s))  Urinalysis, Routine w reflex microscopic     Status: Abnormal   Collection Time: 05/18/18  9:00 PM  Result Value Ref Range   Color, Urine YELLOW YELLOW   APPearance HAZY (A) CLEAR   Specific Gravity, Urine 1.017 1.005 - 1.030   pH 6.0 5.0 - 8.0   Glucose, UA NEGATIVE NEGATIVE mg/dL   Hgb urine dipstick MODERATE (A) NEGATIVE   Bilirubin Urine NEGATIVE NEGATIVE   Ketones, ur NEGATIVE NEGATIVE mg/dL   Protein, ur NEGATIVE NEGATIVE mg/dL   Nitrite NEGATIVE NEGATIVE   Leukocytes, UA SMALL (A) NEGATIVE   RBC / HPF >50 (H) 0 - 5 RBC/hpf   WBC, UA 0-5 0 - 5 WBC/hpf   Bacteria, UA RARE (A) NONE SEEN   Squamous Epithelial / LPF 6-10 0 - 5   Mucus PRESENT   CBC with Differential/Platelet     Status: Abnormal   Collection Time: 05/18/18  9:32 PM  Result Value Ref Range   WBC 8.7 4.0 - 10.5 K/uL    RBC 3.57 (L) 3.87 - 5.11 MIL/uL   Hemoglobin 10.3 (L) 12.0 - 15.0 g/dL   HCT 16.1 (L) 09.6 - 04.5 %   MCV 84.3 78.0 - 100.0 fL   MCH 28.9 26.0 - 34.0 pg   MCHC 34.2 30.0 - 36.0 g/dL   RDW 40.9 81.1 - 91.4 %   Platelets 208 150 - 400 K/uL   Neutrophils Relative % 71 %   Neutro Abs 6.2 1.7 - 7.7 K/uL  Lymphocytes Relative 23 %   Lymphs Abs 2.0 0.7 - 4.0 K/uL   Monocytes Relative 4 %   Monocytes Absolute 0.4 0.1 - 1.0 K/uL   Eosinophils Relative 1 %   Eosinophils Absolute 0.1 0.0 - 0.7 K/uL   Basophils Relative 1 %   Basophils Absolute 0.1 0.0 - 0.1 K/uL      Imaging:  [place] Renal  Result Date: 05/18/2018 CLINICAL DATA:  LEFT flank pain. Microhematuria. Twenty-eight weeks pregnant. EXAM: RENAL / URINARY TRACT ULTRASOUND COMPLETE COMPARISON:  None. FINDINGS: Right Kidney: Length: 10.5 cm. No hydronephrosis. 4 mm calculus which appears nonobstructive. Left Kidney: Length: 11.1 cm. Mild hydronephrosis. 5 mm calculus. No hydroureter is seen. Bladder: Bilateral ureteral jets present.  Fluid fluid level in the bladder IMPRESSION: 1. Mild LEFT hydronephrosis without obstructing lesion identified. 2. Bilateral ureteral jets identified. 3. Bilateral renal calculi. 4. A fluid fluid level within the bladder could represent blood or debris. Electronically Signed   By: Genevive BiStewart  Edmunds M.D.   On: 05/18/2018 23:48    MAU Course/MDM: Positive but mild CVA tenderness on left but no evidence of infection or systemic symptoms Hematuria noted on UA so pt sent for renal US Bilateral renal calculi noted NST reviewed and reactive Consult Dr Dion BodyVarnado with presentation, exam findings and test results.  D/C home with pain management with Tramadol, increase PO fluids, pt to strain urine  F/U in office this week and referral to urology made by Dr Dion BodyVarnado Pt discharge with strict return precautions.  Assessment: 1. Kidney stone complicating pregnancy, third trimester   2. Hematuria, microscopic   3. Acute  left-sided low back pain     Plan: Discharge home Labor precautions and fetal kick counts Follow-up Information    Myna HidalgoOzan, Jennifer, DO Follow up.   Specialty:  Obstetrics and Gynecology Why:  Follow up this week. Your office will call you with referral to Urology.  Return to MAU with worsening pain or emergencies. Contact information: [address] [place] [address] [phone]          Allergies as of 05/19/2018   No Known Allergies     Medication List    STOP taking these medications   estradiol valerate 20 MG/ML injection Commonly known as:  DELESTROGEN   ibuprofen 600 MG tablet Commonly known as:  ADVIL,MOTRIN     TAKE these medications   ASPIRIN 81 PO   cyclobenzaprine 10 MG tablet Commonly known as:  FLEXERIL Take 1 tablet (10 mg total) by mouth every 8 (eight) hours as needed for muscle spasms.   prenatal multivitamin Tabs tablet Take 1 tablet by mouth daily at 12 noon.   promethazine 12.5 MG tablet Commonly known as:  PHENERGAN Take 1 tablet (12.5 mg total) by mouth every 6 (six) hours as needed for nausea or vomiting.   ranitidine 150 MG capsule Commonly known as:  ZANTAC Take 150 mg by mouth 2 (two) times daily.   traMADol 50 MG tablet Commonly known as:  ULTRAM Take 1 tablet (50 mg total) by mouth every 6 (six) hours as needed.       Sharen CounterLisa Leftwich-Kirby Certified Nurse-Midwife 05/19/2018 2:02 AM

## 2018-05-18 NOTE — MAU Note (Signed)
Pt presents to MAU c/o left mid back pain. Pt rates the pain a 4/10 currently pt states the pain worsens at times. Pt reports having occasional SOB when the pain is unbearable. Pt denies LOF or bleeding and reports +FM.

## 2018-05-19 MED ORDER — TRAMADOL HCL 50 MG PO TABS: 50.0000 mg | ORAL_TABLET | Freq: Four times a day (QID) | ORAL | 0 refills | Status: DC | PRN

## 2018-05-23 DIAGNOSIS — Z3482 Encounter for supervision of other normal pregnancy, second trimester: Secondary | ICD-10-CM | POA: Diagnosis not present

## 2018-06-07 DIAGNOSIS — Z23 Encounter for immunization: Secondary | ICD-10-CM | POA: Diagnosis not present

## 2018-07-16 DIAGNOSIS — Z3483 Encounter for supervision of other normal pregnancy, third trimester: Secondary | ICD-10-CM | POA: Diagnosis not present

## 2018-07-22 DIAGNOSIS — R300 Dysuria: Secondary | ICD-10-CM | POA: Diagnosis not present

## 2018-07-24 DIAGNOSIS — O26843 Uterine size-date discrepancy, third trimester: Secondary | ICD-10-CM | POA: Diagnosis not present

## 2018-08-07 ENCOUNTER — Telehealth (HOSPITAL_COMMUNITY): Payer: Self-pay | Admitting: *Deleted

## 2018-08-07 NOTE — Telephone Encounter (Signed)
Preadmission screen  

## 2018-08-10 ENCOUNTER — Encounter (HOSPITAL_COMMUNITY): Payer: Self-pay | Admitting: *Deleted

## 2018-08-10 ENCOUNTER — Inpatient Hospital Stay (HOSPITAL_COMMUNITY)
Admission: AD | Admit: 2018-08-10 | Discharge: 2018-08-11 | DRG: 807 | Disposition: A | Discharge status: Home / Self Care | Payer: BLUE CROSS/BLUE SHIELD | Attending: Obstetrics and Gynecology | Admitting: Obstetrics and Gynecology

## 2018-08-10 ENCOUNTER — Other Ambulatory Visit: Payer: Self-pay

## 2018-08-10 DIAGNOSIS — Z3A40 40 weeks gestation of pregnancy: Secondary | ICD-10-CM

## 2018-08-10 DIAGNOSIS — Z3483 Encounter for supervision of other normal pregnancy, third trimester: Secondary | ICD-10-CM | POA: Diagnosis not present

## 2018-08-10 DIAGNOSIS — Z333 Pregnant state, gestational carrier: Secondary | ICD-10-CM

## 2018-08-10 DIAGNOSIS — O9081 Anemia of the puerperium: Secondary | ICD-10-CM

## 2018-08-10 LAB — CBC WITH DIFFERENTIAL/PLATELET
Basophils Absolute: 0 10*3/uL (ref 0.0–0.1)
Basophils Relative: 0 %
Eosinophils Absolute: 0 10*3/uL (ref 0.0–0.7)
Eosinophils Relative: 0 %
HCT: 30.5 % — ABNORMAL LOW (ref 36.0–46.0)
Hemoglobin: 9.7 g/dL — ABNORMAL LOW (ref 12.0–15.0)
Lymphocytes Relative: 9 %
Lymphs Abs: 1.5 10*3/uL (ref 0.7–4.0)
MCH: 25.5 pg — ABNORMAL LOW (ref 26.0–34.0)
MCHC: 31.8 g/dL (ref 30.0–36.0)
MCV: 80.1 fL (ref 78.0–100.0)
Monocytes Absolute: 0.4 10*3/uL (ref 0.1–1.0)
Monocytes Relative: 3 %
Neutro Abs: 14.8 10*3/uL — ABNORMAL HIGH (ref 1.7–7.7)
Neutrophils Relative %: 88 %
Platelets: 161 10*3/uL (ref 150–400)
RBC: 3.81 MIL/uL — ABNORMAL LOW (ref 3.87–5.11)
RDW: 14.5 % (ref 11.5–15.5)
WBC: 16.7 10*3/uL — ABNORMAL HIGH (ref 4.0–10.5)

## 2018-08-10 LAB — TYPE AND SCREEN
ABO/RH(D): O POS
Antibody Screen: NEGATIVE

## 2018-08-10 LAB — CBC
HCT: 34.2 % — ABNORMAL LOW (ref 36.0–46.0)
Hemoglobin: 10.7 g/dL — ABNORMAL LOW (ref 12.0–15.0)
MCH: 24.9 pg — ABNORMAL LOW (ref 26.0–34.0)
MCHC: 31.3 g/dL (ref 30.0–36.0)
MCV: 79.7 fL (ref 78.0–100.0)
Platelets: 182 10*3/uL (ref 150–400)
RBC: 4.29 MIL/uL (ref 3.87–5.11)
RDW: 14.6 % (ref 11.5–15.5)
WBC: 8.6 10*3/uL (ref 4.0–10.5)

## 2018-08-10 MED ORDER — METHYLERGONOVINE MALEATE 0.2 MG/ML IJ SOLN
0.2000 mg | Freq: Once | INTRAMUSCULAR | Status: AC
  Administered 2018-08-10: 0.2 mg via INTRAMUSCULAR
  Filled 2018-08-10: qty 1

## 2018-08-10 MED ORDER — ONDANSETRON HCL 4 MG/2ML IJ SOLN: 4.0000 mg | Freq: Four times a day (QID) | INTRAMUSCULAR | Status: DC | PRN

## 2018-08-10 MED ORDER — FENTANYL CITRATE (PF) 100 MCG/2ML IJ SOLN
50.0000 ug | INTRAMUSCULAR | Status: DC | PRN
  Administered 2018-08-10 (×2): 100 ug via INTRAVENOUS
  Filled 2018-08-10 (×2): qty 2

## 2018-08-10 MED ORDER — DIPHENHYDRAMINE HCL 25 MG PO CAPS: 25.0000 mg | ORAL_CAPSULE | Freq: Four times a day (QID) | ORAL | Status: DC | PRN

## 2018-08-10 MED ORDER — IBUPROFEN 600 MG PO TABS
600.0000 mg | ORAL_TABLET | Freq: Four times a day (QID) | ORAL | Status: DC
  Administered 2018-08-10 – 2018-08-11 (×3): 600 mg via ORAL
  Filled 2018-08-10 (×3): qty 1

## 2018-08-10 MED ORDER — BENZOCAINE-MENTHOL 20-0.5 % EX AERO
1.0000 "application " | INHALATION_SPRAY | CUTANEOUS | Status: DC | PRN
  Administered 2018-08-10: 1 via TOPICAL
  Filled 2018-08-10: qty 56

## 2018-08-10 MED ORDER — SENNOSIDES-DOCUSATE SODIUM 8.6-50 MG PO TABS
2.0000 | ORAL_TABLET | ORAL | Status: DC
  Filled 2018-08-10: qty 2

## 2018-08-10 MED ORDER — COCONUT OIL OIL: 1.0000 "application " | TOPICAL_OIL | Status: DC | PRN

## 2018-08-10 MED ORDER — WITCH HAZEL-GLYCERIN EX PADS: 1.0000 "application " | MEDICATED_PAD | CUTANEOUS | Status: DC | PRN

## 2018-08-10 MED ORDER — ACETAMINOPHEN 325 MG PO TABS: 650.0000 mg | ORAL_TABLET | ORAL | Status: DC | PRN

## 2018-08-10 MED ORDER — DIBUCAINE 1 % RE OINT: 1.0000 "application " | TOPICAL_OINTMENT | RECTAL | Status: DC | PRN

## 2018-08-10 MED ORDER — OXYTOCIN BOLUS FROM INFUSION
500.0000 mL | Freq: Once | INTRAVENOUS | Status: AC
  Administered 2018-08-10: 500 mL via INTRAVENOUS

## 2018-08-10 MED ORDER — SOD CITRATE-CITRIC ACID 500-334 MG/5ML PO SOLN: 30.0000 mL | ORAL | Status: DC | PRN

## 2018-08-10 MED ORDER — FERROUS SULFATE 325 (65 FE) MG PO TABS
325.0000 mg | ORAL_TABLET | Freq: Every day | ORAL | Status: DC
  Administered 2018-08-11: 325 mg via ORAL
  Filled 2018-08-10: qty 1

## 2018-08-10 MED ORDER — INFLUENZA VAC SPLIT QUAD 0.5 ML IM SUSY: 0.5000 mL | PREFILLED_SYRINGE | INTRAMUSCULAR | Status: DC

## 2018-08-10 MED ORDER — LACTATED RINGERS IV SOLN: 500.0000 mL | INTRAVENOUS | Status: DC | PRN

## 2018-08-10 MED ORDER — ONDANSETRON HCL 4 MG PO TABS
4.0000 mg | ORAL_TABLET | ORAL | Status: DC | PRN
  Administered 2018-08-10: 4 mg via ORAL
  Filled 2018-08-10: qty 1

## 2018-08-10 MED ORDER — SODIUM CHLORIDE 0.9% FLUSH: 3.0000 mL | Freq: Two times a day (BID) | INTRAVENOUS | Status: DC

## 2018-08-10 MED ORDER — ONDANSETRON HCL 4 MG/2ML IJ SOLN: 4.0000 mg | INTRAMUSCULAR | Status: DC | PRN

## 2018-08-10 MED ORDER — PRENATAL MULTIVITAMIN CH: 1.0000 | ORAL_TABLET | Freq: Every day | ORAL | Status: DC

## 2018-08-10 MED ORDER — OXYTOCIN 40 UNITS IN LACTATED RINGERS INFUSION - SIMPLE MED
2.5000 [IU]/h | INTRAVENOUS | Status: DC
  Filled 2018-08-10: qty 1000

## 2018-08-10 MED ORDER — LIDOCAINE HCL (PF) 1 % IJ SOLN
30.0000 mL | INTRAMUSCULAR | Status: DC | PRN
  Administered 2018-08-10: 30 mL via SUBCUTANEOUS
  Filled 2018-08-10: qty 30

## 2018-08-10 MED ORDER — OXYCODONE-ACETAMINOPHEN 5-325 MG PO TABS
1.0000 | ORAL_TABLET | ORAL | Status: DC | PRN
Start: 2018-08-10 — End: 2018-08-10

## 2018-08-10 MED ORDER — OXYCODONE-ACETAMINOPHEN 5-325 MG PO TABS
2.0000 | ORAL_TABLET | ORAL | Status: DC | PRN
Start: 2018-08-10 — End: 2018-08-10

## 2018-08-10 MED ORDER — TETANUS-DIPHTH-ACELL PERTUSSIS 5-2.5-18.5 LF-MCG/0.5 IM SUSP: 0.5000 mL | Freq: Once | INTRAMUSCULAR | Status: DC

## 2018-08-10 MED ORDER — SODIUM CHLORIDE 0.9 % IV SOLN: 250.0000 mL | INTRAVENOUS | Status: DC | PRN

## 2018-08-10 MED ORDER — ZOLPIDEM TARTRATE 5 MG PO TABS: 5.0000 mg | ORAL_TABLET | Freq: Every evening | ORAL | Status: DC | PRN

## 2018-08-10 MED ORDER — SIMETHICONE 80 MG PO CHEW: 80.0000 mg | CHEWABLE_TABLET | ORAL | Status: DC | PRN

## 2018-08-10 MED ORDER — SODIUM CHLORIDE 0.9% FLUSH: 3.0000 mL | INTRAVENOUS | Status: DC | PRN

## 2018-08-10 NOTE — Progress Notes (Signed)
S: RN called because patient had an additional 200 EBL and is feeling fatigued. Patient voided and uterus felt firm to RN.  O: Fundus is firm and midline, small amount of blood noted on pad.  A/P: CBC ordered to assess patient's H&H, Methergine IM ordered x1 dose for EBL of 850. Will start patient on PO Iron if Hgb <10.   Janeece Riggers 08/10/18 5:13 PM

## 2018-08-10 NOTE — MAU Note (Signed)
Pt presents to MAU with complaints of contractions that started around 330 this morning. Denies any abnormal discharge or VB. +FM

## 2018-08-10 NOTE — H&P (Signed)
Katrina [name] is a 32 y.o. female presenting for active labor. Patient of Dr. Richardson Dopp. Patient is a surrogate. OB History    Gravida  3   Para  2   Term  2   Preterm      AB      Living  2     SAB      TAB      Ectopic      Multiple  0   Live Births  2          Past Medical History:  Diagnosis Date  . Medical history non-contributory    Past Surgical History:  Procedure Laterality Date  . APPENDECTOMY     Family History: family history includes Cancer in her maternal grandfather. Social History:  reports that she has never smoked. She has never used smokeless tobacco. She reports that she does not drink alcohol or use drugs.     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   Review of Systems  Gastrointestinal: Positive for abdominal pain.  All other systems reviewed and are negative.  Maternal Medical History:  Reason for admission: Contractions.   Contractions: Onset was 6-12 hours ago.   Frequency: regular.   Perceived severity is strong.    Fetal activity: Perceived fetal activity is normal.   Last perceived fetal movement was within the past hour.    Prenatal Complications - Diabetes: none.    Dilation: 6 Effacement (%): 90 Station: -2 Exam by:: ginger morris rn   Vitals:   08/10/18 1136 08/10/18 1326  BP: 133/85 137/83  Pulse: 81 68  Resp: 18 18  Temp: 98 F (36.7 C) 98.3 F (36.8 C)  TempSrc: Oral Oral  Weight:  63.5 kg  Height:  5' (1.524 m)   Results for orders placed or performed during the hospital encounter of 08/10/18 (from the past 24 hour(s))  Type and screen [facility] [place]     Status: None   Collection Time: 08/10/18 12:47 PM  Result  Value Ref Range   ABO/RH(D) O POS    Antibody Screen NEG    Sample Expiration      08/13/2018 Performed at [facility], [address], [place], [place] [address]   CBC     Status: Abnormal   Collection Time: 08/10/18 12:56 PM  Result Value Ref Range   WBC 8.6 4.0 - 10.5 K/uL   RBC 4.29 3.87 - 5.11 MIL/uL   Hemoglobin 10.7 (L) 12.0 - 15.0 g/dL   HCT 96.2 (L) 95.2 - 84.1 %   MCV 79.7 78.0 - 100.0 fL   MCH 24.9 (L) 26.0 - 34.0 pg   MCHC 31.3 30.0 - 36.0 g/dL   RDW 32.4 40.1 - 02.7 %   Platelets 182 150 - 400 K/uL    Maternal Exam:  Uterine Assessment: Contraction strength is firm.  Contraction frequency is irregular.   Abdomen: Patient reports no abdominal tenderness. Fundal height is Size=dates.   Estimated fetal weight is 7.5lbs.   Fetal presentation: vertex  Introitus: Normal vulva. Normal vagina.  Amniotic fluid character: not assessed.  Pelvis: adequate for delivery.   Cervix: Cervix evaluated by digital exam.     Fetal Exam Fetal Monitor Review: Mode: ultrasound.   Baseline rate: 125.  Variability: moderate (6-25 bpm).   Pattern: no accelerations and no decelerations.    Fetal State Assessment: Category I - tracings are normal.     Physical Exam  Nursing note and vitals reviewed. Constitutional: She is oriented to person, place, and time. She appears well-developed and well-nourished.  HENT:  Head: Normocephalic.  Eyes: Pupils are equal, round, and reactive to light.  Cardiovascular: Normal rate, regular rhythm and normal heart sounds.  Respiratory: Effort normal and breath sounds normal.  GI: There is no tenderness.  Genitourinary: Vagina normal and uterus normal.  Musculoskeletal: Normal range of motion.  Neurological: She is alert and oriented to person, place, and time.  Skin: Skin is warm and dry.  Psychiatric: She has a normal mood and affect. Her behavior is normal. Judgment and thought content normal.    Prenatal labs: ABO, Rh: --/--/O POS  (09/07 1247) Antibody: NEG (09/07 1247) Rubella:  Immune RPR:   NR HBsAg:   NR HIV:   NR GBS:   Negative  Assessment/Plan: 31 y.o. G3P2 at [redacted]w[redacted]d Active labor Category 1 FHTs Surrogate pregnancy Admit to L&D Anticipate NSVD   Janeece Riggers 08/10/2018, 2:15 PM

## 2018-08-11 DIAGNOSIS — O9081 Anemia of the puerperium: Secondary | ICD-10-CM

## 2018-08-11 LAB — CBC
HCT: 25.8 % — ABNORMAL LOW (ref 36.0–46.0)
Hemoglobin: 8.1 g/dL — ABNORMAL LOW (ref 12.0–15.0)
MCH: 25.1 pg — ABNORMAL LOW (ref 26.0–34.0)
MCHC: 31.4 g/dL (ref 30.0–36.0)
MCV: 79.9 fL (ref 78.0–100.0)
Platelets: 143 10*3/uL — ABNORMAL LOW (ref 150–400)
RBC: 3.23 MIL/uL — ABNORMAL LOW (ref 3.87–5.11)
RDW: 14.6 % (ref 11.5–15.5)
WBC: 13.4 10*3/uL — ABNORMAL HIGH (ref 4.0–10.5)

## 2018-08-11 LAB — RPR: RPR Ser Ql: NONREACTIVE

## 2018-08-11 MED ORDER — FERROUS SULFATE 325 (65 FE) MG PO TABS: 325.0000 mg | ORAL_TABLET | Freq: Two times a day (BID) | ORAL | 3 refills | Status: AC

## 2018-08-11 MED ORDER — IBUPROFEN 600 MG PO TABS: 600.0000 mg | ORAL_TABLET | Freq: Four times a day (QID) | ORAL | 0 refills | Status: DC

## 2018-08-11 NOTE — Discharge Summary (Signed)
SVD OB Discharge Summary     Patient Name: Katrina [name] DOB: December 07, 1985 MRN: [id number]  Date of admission: 08/10/2018 Delivering MD: Kathalene Frames K  Date of delivery: 08/10/2018 Type of delivery: SVD  Newborn Data: MOTHER IS A SURROGATE AND INFANT WITH LOCATED IN HOSPITAL WITH ADOPTED PARENTS.    Circumcision: possible OU-PT Live born female  Birth Weight: 8 lb 2 oz (3685 g) APGAR: 9, 9  Newborn Delivery   Birth date/time:  08/10/2018 14:59:00 Delivery type:  Vaginal, Spontaneous     Feeding: bottle due to adoption Infant being discharge to home with mother in stable condition.   Admitting diagnosis: 40.2 WKS, LABOR CHECK Intrauterine pregnancy: [redacted]w[redacted]d     Secondary diagnosis:  Active Problems:   Normal labor   Surrogate pregnancy   Postpartum anemia   Normal postpartum course                                Complications: None                                                              Intrapartum Procedures: spontaneous vaginal delivery Postpartum Procedures: none Complications-Operative and Postpartum: periclitoral tear, repaired.  Augmentation: AROM   History of Present Illness: [name] is a 32 y.o. female, G3P3003, who presents at [redacted]w[redacted]d weeks gestation. The patient has been followed at  [facility]  Her pregnancy has been complicated by:  @PROB   Hospital course:  Onset of Labor With Vaginal Delivery     32 y.o. yo G3P3003 at [redacted]w[redacted]d was admitted in Active Labor on 08/10/2018. Patient had an uncomplicated labor course as follows:  Membrane Rupture Time/Date: 2:43 PM ,08/10/2018   Intrapartum Procedures: Episiotomy: None [1]                                         Lacerations:     Patient had a delivery of a Viable infant. 08/10/2018  Information for the patient's newborn:  10/10/2018 Katrina [name]  Delivery Method: Vaginal, Spontaneous(Filed from Delivery Summary)    Pateint had an uncomplicated postpartum  course.  She is ambulating, tolerating a regular diet, passing flatus, and urinating well. Patient is discharged home in stable condition on 08/11/18.  Postpartum Day # 1 : S/P NSVD due to spontaneous labor and a surrogate parent . Patient up ad lib, denies syncope or dizziness. Reports consuming regular diet without issues and denies N/V. Patient reports 0 bowel movement + passing flatus.  Denies issues with urination and reports bleeding is "light."  Patient is complained of a little weakness but over all feeling well and would like to go home. Pt not going home with mother due to adopted out.   Physical exam  Vitals:   08/10/18 1953 08/10/18 2308 08/11/18 0634 08/11/18 0738  BP: (!) 131/92 126/89 115/74 120/83  Pulse: 73 74 83 68  Resp: 16 15 16 18   Temp: 98.8 F (37.1 C) 98.7 F (37.1 C) 97.9 F (36.6 C) 98.5 F (36.9 C)  TempSrc: Oral Oral Oral Oral  SpO2: 100% 100% 98% 100%  Weight:      Height:       General: alert, cooperative and no distress Lochia: appropriate Uterine Fundus: firm Perineum: intact, approximate, no erythema or hematoma.  DVT Evaluation: No evidence of DVT seen on physical exam. Negative Homan's sign. No cords or calf tenderness. No significant calf/ankle edema.  Labs: Lab Results  Component Value Date   WBC 13.4 (H) 08/11/2018   HGB 8.1 (L) 08/11/2018   HCT 25.8 (L) 08/11/2018   MCV 79.9 08/11/2018   PLT 143 (L) 08/11/2018   No flowsheet data found.  Date of discharge: 08/11/2018 Discharge Diagnoses: Term Pregnancy-delivered Discharge instruction: per After Visit Summary and "Baby and Me Booklet".  After visit meds:  Allergies as of 08/11/2018   No Known Allergies     Medication List    TAKE these medications   cyclobenzaprine 10 MG tablet Commonly known as:  FLEXERIL Take 1 tablet (10 mg total) by mouth every 8 (eight) hours as needed for muscle spasms.   ferrous sulfate 325 (65 FE) MG tablet Take 1 tablet (325 mg total) by mouth 2 (two)  times daily with a meal.   ibuprofen 600 MG tablet Commonly known as:  ADVIL,MOTRIN Take 1 tablet (600 mg total) by mouth every 6 (six) hours.   prenatal multivitamin Tabs tablet Take 1 tablet by mouth daily at 12 noon.   promethazine 12.5 MG tablet Commonly known as:  PHENERGAN Take 1 tablet (12.5 mg total) by mouth every 6 (six) hours as needed for nausea or vomiting.   traMADol 50 MG tablet Commonly known as:  ULTRAM Take 1 tablet (50 mg total) by mouth every 6 (six) hours as needed.       Activity:           pelvic rest Advance as tolerated. Pelvic rest for 6 weeks.  Diet:                routine Medications: Ibuprofen, Colace and Iron Postpartum contraception: Not Discussed Condition:  Pt discharge to home without baby in stable Anemia: IRON  Meds: Allergies as of 08/11/2018   No Known Allergies     Medication List    TAKE these medications   cyclobenzaprine 10 MG tablet Commonly known as:  FLEXERIL Take 1 tablet (10 mg total) by mouth every 8 (eight) hours as needed for muscle spasms.   ferrous sulfate 325 (65 FE) MG tablet Take 1 tablet (325 mg total) by mouth 2 (two) times daily with a meal.   ibuprofen 600 MG tablet Commonly known as:  ADVIL,MOTRIN Take 1 tablet (600 mg total) by mouth every 6 (six) hours.   prenatal multivitamin Tabs tablet Take 1 tablet by mouth daily at 12 noon.   promethazine 12.5 MG tablet Commonly known as:  PHENERGAN Take 1 tablet (12.5 mg total) by mouth every 6 (six) hours as needed for nausea or vomiting.   traMADol 50 MG tablet Commonly known as:  ULTRAM Take 1 tablet (50 mg total) by mouth every 6 (six) hours as needed.       Discharge Follow Up:  Follow-up Information    [facility] Follow up in 6 week(s).   Specialty:  Obstetrics and Gynecology Contact information: [address] [place] [address] [phone]           [place], NP-C,  CNM 08/11/2018, 9:56 AM  [place] , FNP

## 2018-08-16 ENCOUNTER — Inpatient Hospital Stay (HOSPITAL_COMMUNITY): Admission: RE | Admit: 2018-08-16 | Payer: BLUE CROSS/BLUE SHIELD | Source: Ambulatory Visit

## 2018-11-13 ENCOUNTER — Encounter (HOSPITAL_BASED_OUTPATIENT_CLINIC_OR_DEPARTMENT_OTHER): Payer: Self-pay

## 2018-11-13 ENCOUNTER — Other Ambulatory Visit: Payer: Self-pay

## 2018-11-13 NOTE — Progress Notes (Signed)
Spoke with scheduler at Dr. Dawayne Patriciaole's office regarding most recent labs with Hgb of 8.1 on 08/11/18. She states there are more recent labs that were done at their office on 09/23/18 with a Hgb that is WNL. She states she will fax over labs and have Dr Richardson Doppole put in orders for surgery.

## 2018-11-15 ENCOUNTER — Other Ambulatory Visit: Payer: Self-pay | Admitting: Obstetrics and Gynecology

## 2018-11-15 DIAGNOSIS — Z308 Encounter for other contraceptive management: Secondary | ICD-10-CM | POA: Diagnosis not present

## 2018-11-18 NOTE — Pre-Procedure Instructions (Signed)
Pt. notified to come for CBC and urine preg.  States will come 11/19/2018.

## 2018-11-19 ENCOUNTER — Encounter (HOSPITAL_BASED_OUTPATIENT_CLINIC_OR_DEPARTMENT_OTHER)
Admission: RE | Admit: 2018-11-19 | Discharge: 2018-11-19 | Disposition: A | Discharge status: Home / Self Care | Payer: BLUE CROSS/BLUE SHIELD | Source: Ambulatory Visit | Attending: Obstetrics and Gynecology | Admitting: Obstetrics and Gynecology

## 2018-11-19 ENCOUNTER — Other Ambulatory Visit: Payer: Self-pay | Admitting: Obstetrics and Gynecology

## 2018-11-19 DIAGNOSIS — Z01812 Encounter for preprocedural laboratory examination: Secondary | ICD-10-CM | POA: Insufficient documentation

## 2018-11-19 DIAGNOSIS — R141 Gas pain: Secondary | ICD-10-CM | POA: Diagnosis not present

## 2018-11-19 DIAGNOSIS — Z302 Encounter for sterilization: Secondary | ICD-10-CM | POA: Diagnosis not present

## 2018-11-19 LAB — POCT PREGNANCY, URINE: Preg Test, Ur: NEGATIVE

## 2018-11-19 LAB — CBC
HCT: 39.2 % (ref 36.0–46.0)
Hemoglobin: 12 g/dL (ref 12.0–15.0)
MCH: 25.4 pg — ABNORMAL LOW (ref 26.0–34.0)
MCHC: 30.6 g/dL (ref 30.0–36.0)
MCV: 83.1 fL (ref 80.0–100.0)
Platelets: 285 10*3/uL (ref 150–400)
RBC: 4.72 MIL/uL (ref 3.87–5.11)
RDW: 15.1 % (ref 11.5–15.5)
WBC: 6.4 10*3/uL (ref 4.0–10.5)
nRBC: 0 % (ref 0.0–0.2)

## 2018-11-19 NOTE — H&P (Signed)
Chief Complaint(s):   desire for permanent sterilization   HPI:  General 32 y/o presents for pre op in preparation for laproscopic bilateral tubal ligation. she desires permanent sterilization.  She had a vaginal delivery where she served as a surrogate which she delivered on 08/10/2018. Pelvic performed at visit today to rule out any discharge from the vagina. Upon examination everything looked normal, no discharge noted. Current Medication: Taking iron 1 tab Oral once a day. Loestrin Fe 1/20(Norethin Ace-Eth Estrad-FE) 1-20 MG-MCG Tablet 1 tablet Orally Daily for Three Weeks, 1 Week off. Not-Taking PNV. Fioricet(Butalbital-APAP-Caffeine) 50-300-40 MG Capsule 1 capsule as needed Orally every 4 hrs. Keflex(Cephalexin) 500 MG Capsule 1 capsule Orally twice a day. Junel 1/20(Norethindrone Acet-Ethinyl Est) 1-20 MG-MCG Tablet 1 tablet Orally Once a day. Medication List reviewed and reconciled with the patient. Medical History:  migraine headaches      Allergies/Intolerance:  N.K.D.A.   Gyn History:  Sexual activity currently sexually active. Periods : every month. LMP 10/27/18. Birth control ocp. Last pap smear date 01/09/2017-negative. Last mammogram date N/A.   OB History:  Number of pregnancies 3. Pregnancy # 1 live birth, vaginal delivery, girl "Bing Plumeminah" 09/02/2013 . Pregnancy # 2 live birth, vaginal delivery, boy "Micheal". Pregnancy # 3 live birth, vaginal delivery, surrogate.   Surgical History:  appendectomy 1999?   Hospitalization:  appendectomy     child birth x2     childbirth-surrogate 08/10/2018   Family History:  Father: alive, A + W    Mother: alive, A + W    1 son(s) , 1 daughter(s) .    denies any GYN family cancer hx.  Social History: General no EXPOSURE TO PASSIVE SMOKE.  no Alcohol.  Children: 1 daughter, 1, son (s).  no Caffeine, None.  Tobacco use cigarettes: Never smoked, Tobacco history last updated 11/15/2018.  Marital Status: married.  no  Recreational drug use.  OCCUPATION: unemployed, Company secretaryCollege Grad.   ROS: CONSTITUTIONAL No" options="no,yes" propid="91" itemid="193425" categoryid="10464" encounterid="[id number]"Chills No. No" options="no,yes" propid="91" itemid="172899" categoryid="10464" encounterid="[id number]"Fatigue No. No" options="no,yes" propid="91" itemid="10467" categoryid="10464" encounterid="[id number]"Fever No. No" options="no,yes" propid="91" itemid="193426" categoryid="10464" encounterid="[id number]"Night sweats No. No" options="no,yes" propid="91" itemid="444261" categoryid="10464" encounterid="[id number]"Recent travel outside [place] No. No" options="no,yes" propid="91" itemid="193427" categoryid="10464" encounterid="[id number]"Sweats No. No" options="no,yes" propid="91" itemid="194825" categoryid="10464" encounterid="[id number]"Weight change No.  OPHTHALMOLOGY no" options="no,yes" propid="91" itemid="12520" categoryid="12516" encounterid="[id number]"Blurring of vision no. no" options="no,yes" propid="91" itemid="193469" categoryid="12516" encounterid="[id number]"Change in vision no. no" options="no,yes" propid="91" itemid="194379" categoryid="12516" encounterid="[id number]"Double vision no.  ENT no" options="no,yes" propid="91" itemid="193612" categoryid="10481" encounterid="[id number]"Dizziness no. Nose bleeds no. Sore throat no. Teeth pain no.  ALLERGY no" options="no,yes" propid="91" itemid="202589" categoryid="138152" encounterid="[id number]"Hives no.  CARDIOLOGY no" options="no,yes" propid="91" itemid="193603" categoryid="10488" encounterid="[id number]"Chest pain no. no" options="no,yes" propid="91" itemid="199089" categoryid="10488" encounterid="[id number]"High blood pressure no. no" options="no,yes" propid="91" itemid="202598" categoryid="10488" encounterid="[id number]"Irregular heart beat no. no" options="no,yes" propid="91" itemid="10491" categoryid="10488" encounterid="[id number]"Leg edema no. no" options="no,yes" propid="91" itemid="10490"  categoryid="10488" encounterid="[id number]"Palpitations no.  RESPIRATORY no" options="no" propid="91" itemid="270013" categoryid="138132" encounterid="[id number]"Shortness of breath no. no" options="no,yes" propid="91" itemid="172745" categoryid="138132" encounterid="[id number]"Cough no. no" options="no,yes" propid="91" itemid="193621" categoryid="138132" encounterid="[id number]"Wheezing no.  UROLOGY no" options="no,yes" propid="91" [phone]="194377" categoryid="138166" encounterid="[id number]"Pain with urination no. no" options="no,yes" propid="91" itemid="193493" categoryid="138166" encounterid="[id number]"Urinary urgency no. no" options="no,yes" propid="91" itemid="193492" categoryid="138166" encounterid="[id number]"Urinary frequency no. no" options="no,yes" propid="91" itemid="138171" categoryid="138166" encounterid="[id number]"Urinary incontinence no. No" options="no,yes" propid="91" itemid="138167" categoryid="138166" encounterid="[id number]"Difficulty urinating No. No" options="no,yes" propid="91" itemid="138168" categoryid="138166" encounterid="[id number]"Blood in urine No.  GASTROENTEROLOGY no" options="no,yes" propid="91" itemid="10496" categoryid="10494" encounterid="[id number]"Abdominal pain no. no" options="no,yes" propid="91" itemid="193447" categoryid="10494" encounterid="[id number]"Appetite change no. no" options="no,yes" propid="91" itemid="193448" categoryid="10494" encounterid="[id number]"Bloating/belching no. no" options="no,yes" propid="91" itemid="10503" categoryid="10494" encounterid="[id number]"Blood in stool or on toilet paper  no. no" options="no,yes" propid="91" itemid="199106" categoryid="10494" encounterid="[id number]"Change in bowel movements no. no" options="no,yes" propid="91" itemid="10501" categoryid="10494" encounterid="[id number]"Constipation no. no" options="no,yes" propid="91" itemid="10502" categoryid="10494" encounterid="[id number]"Diarrhea no. no" options="no,yes" propid="91" itemid="199104"  categoryid="10494" encounterid="[id number]"Difficulty swallowing no. no" options="no,yes" propid="91" itemid="10499" categoryid="10494" encounterid="[id number]"Nausea no.  FEMALE REPRODUCTIVE no" options="no,yes" propid="91" itemid="453725" categoryid="10525" encounterid="[id number]"Vulvar pain no. no" options="no,yes" propid="91" itemid="453726" categoryid="10525" encounterid="[id number]"Vulvar rash no. no" options="no, yes" propid="91" itemid="444315" categoryid="10525" encounterid="[id number]"Abnormal vaginal bleeding no. no" options="no,yes" propid="91" itemid="186083" categoryid="10525" encounterid="[id number]"Breast pain no. no" options="no,yes" propid="91" itemid="186084" categoryid="10525" encounterid="[id number]"Nipple discharge no. no" options="no,yes" propid="91" itemid="275823" categoryid="10525" encounterid="[id number]"Pain with intercourse no. no" options="no,yes" propid="91" itemid="186082" categoryid="10525" encounterid="[id number]"Pelvic pain no. no" options="no,yes" propid="91" itemid="278230" categoryid="10525" encounterid="[id number]"Unusual vaginal discharge no. no" options="no,yes" propid="91" itemid="278942" categoryid="10525" encounterid="[id number]"Vaginal itching no.  MUSCULOSKELETAL no" options="no,yes" propid="91" itemid="193461" categoryid="10514" encounterid="[id number]"Muscle aches no.  NEUROLOGY no" options="no,yes" propid="91" itemid="12513" categoryid="12512" encounterid="[id number]"Headache no. no" options="no,yes" propid="91" itemid="12514" categoryid="12512" encounterid="[id number]"Tingling/numbness no. no" options="no,yes" propid="91" itemid="193468" categoryid="12512" encounterid="[id number]"Weakness no.  PSYCHOLOGY no" options="" propid="91" itemid="275919" categoryid="10520" encounterid="[id number]"Depression no. no" options="no,yes" propid="91" itemid="172748" categoryid="10520" encounterid="[id number]"Anxiety no. no" options="no,yes" propid="91" itemid="199158" categoryid="10520"  encounterid="[id number]"Nervousness no. no" options="no,yes" propid="91" itemid="12502" categoryid="10520" encounterid="[id number]"Sleep disturbances no. no " options="no,yes" propid="91" itemid="72718" categoryid="10520" encounterid="[id number]"Suicidal ideation no .  ENDOCRINOLOGY no" options="no,yes" propid="91" itemid="194628" categoryid="12508" encounterid="[id number]"Excessive thirst no. no" options="no,yes" propid="91" itemid="196285" categoryid="12508" encounterid="[id number]"Excessive urination no. no" options="no, yes" propid="91" itemid="444314" categoryid="12508" encounterid="[id number]"Hair loss no. no" options="" propid="91" itemid="447284" categoryid="12508" encounterid="[id number]"Heat or cold intolerance no.  HEMATOLOGY/LYMPH no" options="no,yes" propid="91" itemid="199152" categoryid="138157" encounterid="[id number]"Abnormal bleeding no. no" options="no,yes" propid="91" itemid="170653" categoryid="138157" encounterid="[id number]"Easy bruising no. no" options="no,yes" propid="91" itemid="138158" categoryid="138157" encounterid="[id number]"Swollen glands no.  DERMATOLOGY no" options="no,yes" propid="91" itemid="199126" categoryid="12503" encounterid="[id number]"New/changing skin lesion no. no" options="no,yes" propid="91" itemid="12504" categoryid="12503" encounterid="[id number]"Rash no. no" options="" propid="91" itemid="444313" categoryid="12503" encounterid="[id number]"Sores no.   Negative except as stated in HPI.  Objective: Vitals: Wt 118, Wt change -21 lb, Ht 60, BMI 23.04, Pulse sitting 79, BP sitting 100/62  Past Results: Examination:  General Examination alert, oriented, NAD " categoryPropId="10089" examid="193638"CONSTITUTIONAL: alert, oriented, NAD .  moist, warm" categoryPropId="10109" examid="193638"SKIN: moist, warm.  Conjunctiva clear" categoryPropId="21468" examid="193638"EYES: Conjunctiva clear.  clear to auscultation bilaterally" categoryPropId="87" examid="193638"LUNGS: clear to  auscultation bilaterally.  regular rate and rhythm" categoryPropId="86" examid="193638"HEART: regular rate and rhythm.  soft, non-tender/non-distended, bowel sounds present " categoryPropId="88" examid="193638"ABDOMEN: soft, non-tender/non-distended, bowel sounds present .  normal external genitalia, labia - unremarkable, vagina - pink moist mucosa, no lesions or abnormal discharge, cervix - no discharge or lesions or CMT, adnexa - no masses or tenderness, uterus - nontender and normal size on palpation " categoryPropId="13414" examid="193638"FEMALE GENITOURINARY: normal external genitalia, labia - unremarkable, vagina - pink moist mucosa, no lesions or abnormal discharge, cervix - no discharge or lesions or CMT, adnexa - no masses or tenderness, uterus - nontender and normal size on palpation .  no edema present" categoryPropId="89" examid="193638"EXTREMITIES: no edema present.  affect normal, good eye contact" categoryPropId="16316" examid="193638"PSYCH: affect normal, good eye contact.  Physical Examination: Chaperone present for pelvic exam, Chapman,Courtney 11/15/2018 "Chaperone present for pelvic exam, Chapman,Courtney 11/15/2018 .   Pt aware of scribe services today.   Assessment: Assessment:  Encounter for other contraceptive management - Z30.8 (Primary)     Plan: Treatment: Encounter for other contraceptive management Notes:  planning laparoscopic bilateral tubal ligation... pt counselled that Tubal ligation is 99% effective, there is 1% of failure. If failure occurs there is 50% risk of ectopic pregnancy which could be life threatening. If misses menses she is to take a pregnancy test and call the office immediately. Not able to drive for 24 hours, no heavy lifting for a week. Patient was advised to  avoid aspirin, aleve, and anything with anti-inflammatories in it from now until surgery. She is to not eat or drink past midnight the night before surgery.    Discussed risks of surgery  with patient including but not limited to infection/bleeding, damage to bowel, bladder ureters and surrounding organs with the need for further surgery.  Discussed risk of blood transfusion. Discussed risk of hiv/hep with blood transfusion. Patient is aware of risks and asks for it if needed.

## 2018-11-19 NOTE — H&P (Deleted)
  The note originally documented on this encounter has been moved the the encounter in which it belongs.  

## 2018-11-20 ENCOUNTER — Encounter (HOSPITAL_COMMUNITY): Payer: Self-pay | Admitting: *Deleted

## 2018-11-20 ENCOUNTER — Ambulatory Visit (HOSPITAL_BASED_OUTPATIENT_CLINIC_OR_DEPARTMENT_OTHER): Payer: BLUE CROSS/BLUE SHIELD | Admitting: Certified Registered"

## 2018-11-20 ENCOUNTER — Encounter (HOSPITAL_BASED_OUTPATIENT_CLINIC_OR_DEPARTMENT_OTHER): Payer: Self-pay | Admitting: *Deleted

## 2018-11-20 ENCOUNTER — Ambulatory Visit (HOSPITAL_BASED_OUTPATIENT_CLINIC_OR_DEPARTMENT_OTHER)
Admission: RE | Admit: 2018-11-20 | Discharge: 2018-11-20 | Disposition: A | Discharge status: Home / Self Care | Payer: BLUE CROSS/BLUE SHIELD | Attending: Obstetrics and Gynecology | Admitting: Obstetrics and Gynecology

## 2018-11-20 ENCOUNTER — Other Ambulatory Visit: Payer: Self-pay

## 2018-11-20 ENCOUNTER — Inpatient Hospital Stay (EMERGENCY_DEPARTMENT_HOSPITAL)
Admission: AD | Admit: 2018-11-20 | Discharge: 2018-11-20 | Disposition: A | Discharge status: Home / Self Care | Payer: BLUE CROSS/BLUE SHIELD | Source: Home / Self Care | Attending: Obstetrics and Gynecology | Admitting: Obstetrics and Gynecology

## 2018-11-20 ENCOUNTER — Encounter (HOSPITAL_BASED_OUTPATIENT_CLINIC_OR_DEPARTMENT_OTHER)
Admission: RE | Disposition: A | Discharge status: Home / Self Care | Payer: Self-pay | Source: Home / Self Care | Attending: Obstetrics and Gynecology

## 2018-11-20 DIAGNOSIS — G8918 Other acute postprocedural pain: Secondary | ICD-10-CM | POA: Insufficient documentation

## 2018-11-20 DIAGNOSIS — R141 Gas pain: Secondary | ICD-10-CM

## 2018-11-20 DIAGNOSIS — Z302 Encounter for sterilization: Secondary | ICD-10-CM | POA: Insufficient documentation

## 2018-11-20 DIAGNOSIS — R1011 Right upper quadrant pain: Secondary | ICD-10-CM

## 2018-11-20 HISTORY — DX: Anemia, unspecified: D64.9

## 2018-11-20 HISTORY — PX: LAPAROSCOPIC TUBAL LIGATION: SHX1937

## 2018-11-20 LAB — TROPONIN I: Troponin I: <0.03 ng/mL (ref <0.03)

## 2018-11-20 SURGERY — LIGATION, FALLOPIAN TUBE, LAPAROSCOPIC
Anesthesia: General | Site: Abdomen | Laterality: Bilateral

## 2018-11-20 MED ORDER — SUGAMMADEX SODIUM 200 MG/2ML IV SOLN
INTRAVENOUS | Status: AC
  Filled 2018-11-20: qty 2

## 2018-11-20 MED ORDER — MIDAZOLAM HCL 2 MG/2ML IJ SOLN
INTRAMUSCULAR | Status: AC
  Filled 2018-11-20: qty 2

## 2018-11-20 MED ORDER — IBUPROFEN 800 MG PO TABS: 800.0000 mg | ORAL_TABLET | Freq: Three times a day (TID) | ORAL | 0 refills | Status: AC | PRN

## 2018-11-20 MED ORDER — LIDOCAINE VISCOUS HCL 2 % MT SOLN
15.0000 mL | Freq: Once | OROMUCOSAL | Status: AC
  Administered 2018-11-20: 15 mL via ORAL
  Filled 2018-11-20: qty 15

## 2018-11-20 MED ORDER — FENTANYL CITRATE (PF) 100 MCG/2ML IJ SOLN
INTRAMUSCULAR | Status: AC
  Filled 2018-11-20: qty 2

## 2018-11-20 MED ORDER — BUPIVACAINE HCL (PF) 0.25 % IJ SOLN
INTRAMUSCULAR | Status: DC | PRN
  Administered 2018-11-20: 20 mL

## 2018-11-20 MED ORDER — LACTATED RINGERS IV SOLN
INTRAVENOUS | Status: DC
  Administered 2018-11-20: 08:00:00 via INTRAVENOUS

## 2018-11-20 MED ORDER — MIDAZOLAM HCL 2 MG/2ML IJ SOLN
1.0000 mg | INTRAMUSCULAR | Status: DC | PRN
  Administered 2018-11-20: 2 mg via INTRAVENOUS

## 2018-11-20 MED ORDER — LIDOCAINE HCL (CARDIAC) PF 100 MG/5ML IV SOSY
PREFILLED_SYRINGE | INTRAVENOUS | Status: DC | PRN
  Administered 2018-11-20: 100 mg via INTRAVENOUS

## 2018-11-20 MED ORDER — SCOPOLAMINE 1 MG/3DAYS TD PT72
1.0000 | MEDICATED_PATCH | Freq: Once | TRANSDERMAL | Status: AC | PRN
  Administered 2018-11-20: 1 via TRANSDERMAL

## 2018-11-20 MED ORDER — DEXAMETHASONE SODIUM PHOSPHATE 10 MG/ML IJ SOLN
INTRAMUSCULAR | Status: AC
  Filled 2018-11-20: qty 1

## 2018-11-20 MED ORDER — ROCURONIUM BROMIDE 100 MG/10ML IV SOLN
INTRAVENOUS | Status: DC | PRN
  Administered 2018-11-20: 40 mg via INTRAVENOUS

## 2018-11-20 MED ORDER — PROPOFOL 10 MG/ML IV BOLUS
INTRAVENOUS | Status: AC
  Filled 2018-11-20: qty 40

## 2018-11-20 MED ORDER — PROPOFOL 10 MG/ML IV BOLUS
INTRAVENOUS | Status: DC | PRN
  Administered 2018-11-20: 200 mg via INTRAVENOUS

## 2018-11-20 MED ORDER — BUPIVACAINE HCL (PF) 0.25 % IJ SOLN
INTRAMUSCULAR | Status: AC
  Filled 2018-11-20: qty 30

## 2018-11-20 MED ORDER — FENTANYL CITRATE (PF) 100 MCG/2ML IJ SOLN
25.0000 ug | INTRAMUSCULAR | Status: DC | PRN
  Administered 2018-11-20: 25 ug via INTRAVENOUS

## 2018-11-20 MED ORDER — SIMETHICONE 80 MG PO CHEW
160.0000 mg | CHEWABLE_TABLET | Freq: Once | ORAL | Status: AC
  Administered 2018-11-20: 160 mg via ORAL
  Filled 2018-11-20: qty 2

## 2018-11-20 MED ORDER — SUGAMMADEX SODIUM 200 MG/2ML IV SOLN
INTRAVENOUS | Status: DC | PRN
  Administered 2018-11-20: 200 mg via INTRAVENOUS

## 2018-11-20 MED ORDER — ALUM & MAG HYDROXIDE-SIMETH 200-200-20 MG/5ML PO SUSP
30.0000 mL | Freq: Once | ORAL | Status: AC
  Administered 2018-11-20: 30 mL via ORAL
  Filled 2018-11-20: qty 30

## 2018-11-20 MED ORDER — LACTATED RINGERS IV SOLN: INTRAVENOUS | Status: DC

## 2018-11-20 MED ORDER — KETOROLAC TROMETHAMINE 30 MG/ML IJ SOLN
INTRAMUSCULAR | Status: DC | PRN
  Administered 2018-11-20: 30 mg via INTRAVENOUS

## 2018-11-20 MED ORDER — DEXAMETHASONE SODIUM PHOSPHATE 4 MG/ML IJ SOLN
INTRAMUSCULAR | Status: DC | PRN
  Administered 2018-11-20: 10 mg via INTRAVENOUS

## 2018-11-20 MED ORDER — ONDANSETRON HCL 4 MG/2ML IJ SOLN
INTRAMUSCULAR | Status: DC | PRN
  Administered 2018-11-20: 4 mg via INTRAVENOUS

## 2018-11-20 MED ORDER — SILVER NITRATE-POT NITRATE 75-25 % EX MISC
CUTANEOUS | Status: AC
  Filled 2018-11-20: qty 1

## 2018-11-20 MED ORDER — HYDROCODONE-ACETAMINOPHEN 5-325 MG PO TABS: 1.0000 | ORAL_TABLET | Freq: Four times a day (QID) | ORAL | 0 refills | Status: AC | PRN

## 2018-11-20 MED ORDER — FENTANYL CITRATE (PF) 100 MCG/2ML IJ SOLN
50.0000 ug | INTRAMUSCULAR | Status: DC | PRN
  Administered 2018-11-20: 100 ug via INTRAVENOUS

## 2018-11-20 MED ORDER — ONDANSETRON HCL 4 MG/2ML IJ SOLN
INTRAMUSCULAR | Status: AC
  Filled 2018-11-20: qty 2

## 2018-11-20 MED ORDER — SCOPOLAMINE 1 MG/3DAYS TD PT72
MEDICATED_PATCH | TRANSDERMAL | Status: AC
  Filled 2018-11-20: qty 1

## 2018-11-20 MED ORDER — LIDOCAINE 2% (20 MG/ML) 5 ML SYRINGE
INTRAMUSCULAR | Status: AC
  Filled 2018-11-20: qty 5

## 2018-11-20 MED ORDER — ROCURONIUM BROMIDE 50 MG/5ML IV SOSY
PREFILLED_SYRINGE | INTRAVENOUS | Status: AC
  Filled 2018-11-20: qty 5

## 2018-11-20 SURGICAL SUPPLY — 25 items
ADH SKN CLS APL DERMABOND .7 (GAUZE/BANDAGES/DRESSINGS) ×1
CATH ROBINSON RED A/P 16FR (CATHETERS) ×1 IMPLANT
DERMABOND ADVANCED (GAUZE/BANDAGES/DRESSINGS) ×1
DERMABOND ADVANCED .7 DNX12 (GAUZE/BANDAGES/DRESSINGS) ×1 IMPLANT
DILATOR CANAL MILEX (MISCELLANEOUS) IMPLANT
DRSG OPSITE POSTOP 3X4 (GAUZE/BANDAGES/DRESSINGS) IMPLANT
DURAPREP 26ML APPLICATOR (WOUND CARE) ×2 IMPLANT
GLOVE BIOGEL M 6.5 STRL (GLOVE) ×2 IMPLANT
GLOVE BIOGEL PI IND STRL 6.5 (GLOVE) ×2 IMPLANT
GLOVE BIOGEL PI IND STRL 7.0 (GLOVE) ×2 IMPLANT
GLOVE BIOGEL PI INDICATOR 6.5 (GLOVE) ×3
GLOVE BIOGEL PI INDICATOR 7.0 (GLOVE) ×2
GOWN STRL REUS W/TWL LRG LVL3 (GOWN DISPOSABLE) ×4 IMPLANT
PACK LAPAROSCOPY BASIN (CUSTOM PROCEDURE TRAY) ×2 IMPLANT
PACK TRENDGUARD 450 HYBRID PRO (MISCELLANEOUS) IMPLANT
PROTECTOR NERVE ULNAR (MISCELLANEOUS) ×2 IMPLANT
SOLUTION ELECTROLUBE (MISCELLANEOUS) ×2 IMPLANT
SUT VICRYL 0 UR6 27IN ABS (SUTURE) ×2 IMPLANT
SUT VICRYL 4-0 PS2 18IN ABS (SUTURE) ×2 IMPLANT
TOWEL OR 17X24 6PK STRL BLUE (TOWEL DISPOSABLE) ×4 IMPLANT
TRENDGUARD 450 HYBRID PRO PACK (MISCELLANEOUS) ×2
TROCAR XCEL NON-BLD 11X100MML (ENDOMECHANICALS) ×2 IMPLANT
TUBING INSUF HEATED (TUBING) ×1 IMPLANT
TUBING INSUFFLATION (TUBING) ×1 IMPLANT
WARMER LAPAROSCOPE (MISCELLANEOUS) ×2 IMPLANT

## 2018-11-20 NOTE — Anesthesia Preprocedure Evaluation (Signed)
Anesthesia Evaluation  Patient identified by MRN, date of birth, ID band Patient awake    Reviewed: Allergy & Precautions, NPO status , Patient's Chart, lab work & pertinent test results  Airway Mallampati: II  TM Distance: >3 FB Neck ROM: Full    Dental no notable dental hx. (+) Dental Advisory Given   Pulmonary neg pulmonary ROS,    Pulmonary exam normal        Cardiovascular negative cardio ROS Normal cardiovascular exam     Neuro/Psych  Headaches, negative psych ROS   GI/Hepatic negative GI ROS, Neg liver ROS,   Endo/Other  negative endocrine ROS  Renal/GU negative Renal ROS  negative genitourinary   Musculoskeletal negative musculoskeletal ROS (+)   Abdominal   Peds negative pediatric ROS (+)  Hematology negative hematology ROS (+)   Anesthesia Other Findings   Reproductive/Obstetrics negative OB ROS                             Anesthesia Physical Anesthesia Plan  ASA: I  Anesthesia Plan: General   Post-op Pain Management:    Induction: Intravenous  PONV Risk Score and Plan: 4 or greater and Ondansetron, Dexamethasone, Scopolamine patch - Pre-op and Diphenhydramine  Airway Management Planned: Oral ETT  Additional Equipment:   Intra-op Plan:   Post-operative Plan: Extubation in OR  Informed Consent: I have reviewed the patients History and Physical, chart, labs and discussed the procedure including the risks, benefits and alternatives for the proposed anesthesia with the patient or authorized representative who has indicated his/her understanding and acceptance.   Dental advisory given  Plan Discussed with: CRNA and Anesthesiologist  Anesthesia Plan Comments:         Anesthesia Quick Evaluation

## 2018-11-20 NOTE — Transfer of Care (Signed)
Immediate Anesthesia Transfer of Care Note  Patient: Katrina [name]  Procedure(s) Performed: LAPAROSCOPIC TUBAL LIGATION (Bilateral Abdomen)  Patient Location: PACU  Anesthesia Type:General  Level of Consciousness: awake, alert  and oriented  Airway & Oxygen Therapy: Patient Spontanous Breathing and Patient connected to face mask oxygen  Post-op Assessment: Report given to RN and Post -op Vital signs reviewed and stable  Post vital signs: Reviewed and stable  Last Vitals:  Vitals Value Taken Time  BP    Temp 36.8 C 11/20/2018  9:40 AM  Pulse 90 11/20/2018  9:40 AM  Resp 15 11/20/2018  9:40 AM  SpO2 100 % 11/20/2018  9:40 AM  Vitals shown include unvalidated device data.  Last Pain:  Vitals:   11/20/18 0801  TempSrc: Oral  PainSc: 0-No pain      Patients Stated Pain Goal: 3 (11/20/18 0801)  Complications: No apparent anesthesia complications

## 2018-11-20 NOTE — Anesthesia Procedure Notes (Signed)
Procedure Name: Intubation Performed by: Karen KitchensKelly, Shatoria Stooksbury M, CRNA Pre-anesthesia Checklist: Patient identified, Emergency Drugs available, Suction available, Patient being monitored and Timeout performed Patient Re-evaluated:Patient Re-evaluated prior to induction Oxygen Delivery Method: Circle system utilized Preoxygenation: Pre-oxygenation with 100% oxygen Induction Type: IV induction Ventilation: Mask ventilation without difficulty Grade View: Grade I Tube type: Oral Tube size: 7.0 mm Number of attempts: 1 Airway Equipment and Method: Stylet Placement Confirmation: ETT inserted through vocal cords under direct vision,  positive ETCO2,  CO2 detector and breath sounds checked- equal and bilateral Secured at: 22 cm Tube secured with: Tape Dental Injury: Teeth and Oropharynx as per pre-operative assessment

## 2018-11-20 NOTE — MAU Provider Note (Signed)
History     CSN: [id number]  Arrival date and time: 11/20/18 1300   First Provider Initiated Contact with Patient 11/20/18 1335      Chief Complaint  Patient presents with  . Shoulder Pain  . Abdominal Pain  . Shortness of Breath   HPI  Ms.Katrina [name] is a 32 y.o. female here with complaints of right upper abdominal pain that radiates up to her right shoulder. She is status post tubal ligation this moring. Says she feels like it may be gas pain however she is unsure. The pain comes and goes. The pain does not worsen with position change. The pain does worsen when she raises her right arm in the air. She has full range of motion to her right arm and right side. She has no fever or N/V.   OB History    Gravida  3   Para  3   Term  3   Preterm      AB      Living  3     SAB      TAB      Ectopic      Multiple  0   Live Births  3           Past Medical History:  Diagnosis Date  . Borderline anemia    takes iron once a day    Past Surgical History:  Procedure Laterality Date  . APPENDECTOMY    . TUBAL LIGATION      Family History  Problem Relation Age of Onset  . Cancer Maternal Grandfather     Social History   Tobacco Use  . Smoking status: Never Smoker  . Smokeless tobacco: Never Used  Substance Use Topics  . Alcohol use: No  . Drug use: No    Allergies: No Known Allergies  Medications Prior to Admission  Medication Sig Dispense Refill Last Dose  . norethindrone-ethinyl estradiol-iron (ESTROSTEP FE,TILIA FE,TRI-LEGEST FE) 1-20/1-30/1-35 MG-MCG tablet Take 1 tablet by mouth daily.   11/19/2018 at Unknown time  . ferrous sulfate 325 (65 FE) MG tablet Take 1 tablet (325 mg total) by mouth 2 (two) times daily with a meal. (Patient taking differently: Take 325 mg by mouth daily with breakfast. ) 30 tablet 3 11/18/2018  . HYDROcodone-acetaminophen (NORCO/VICODIN) 5-325 MG tablet Take 1 tablet by mouth every 6 (six) hours as needed for  moderate pain. (Patient not taking: Reported on 11/20/2018) 20 tablet 0 Not Taking at Unknown time  . ibuprofen (ADVIL,MOTRIN) 800 MG tablet Take 1 tablet (800 mg total) by mouth every 8 (eight) hours as needed. (Patient not taking: Reported on 11/20/2018) 30 tablet 0 Not Taking at Unknown time   Results for orders placed or performed during the hospital encounter of 11/20/18 (from the past 48 hour(s))  Troponin I - Once     Status: None   Collection Time: 11/20/18  1:58 PM  Result Value Ref Range   Troponin I <0.03 <0.03 ng/mL    Comment: Performed at [facility], [address] [place], [place] [address]    Review of Systems  Constitutional: Negative for fever.  Respiratory: Negative for shortness of breath.   Gastrointestinal: Positive for abdominal pain. Negative for nausea and vomiting.   Physical Exam   Blood pressure 108/72, pulse 82, temperature 98 F (36.7 C), temperature source Oral, resp. rate 18, height 5' (1.524 m), weight 54 kg, last menstrual period 10/29/2018, SpO2 100 %, unknown if currently breastfeeding.  Physical  Exam  Constitutional: She is oriented to person, place, and time. She appears well-developed and well-nourished.  Non-toxic appearance. She does not have a sickly appearance. She does not appear ill. No distress.  Respiratory: Effort normal. She exhibits tenderness.    Musculoskeletal: Normal range of motion.  Neurological: She is alert and oriented to person, place, and time. She has normal strength. GCS eye subscore is 4. GCS verbal subscore is 5. GCS motor subscore is 6.  Skin: Skin is warm. She is not diaphoretic. There is pallor.  Psychiatric: Her behavior is normal.   MAU Course  Procedures  None  MDM  Troponin negative EKG normal: reviewed with Dr. Tawanna Sat.  Gas X given PO  Mylanta given Patient feeling significantly better. Pain down to 0/10. She feels the gas pain is moving now. Patient post op- went home today. Discussed patient  with Dr. Richardson Dopp. Ok for DC home.   Assessment and Plan   A:  1. Gas pain   2. Post-op pain     P:  Discharge home in stable condition Return to MAU if symptoms worsen Keep f/u appointment.  Ok to use Gas X as directed on the bottle.   Duane Lope, NP 11/20/2018 8:54 PM

## 2018-11-20 NOTE — H&P (Signed)
Date of Initial H&P: 11/19/2018  History reviewed, patient examined, no change in status, stable for surgery.

## 2018-11-20 NOTE — Op Note (Signed)
11/20/2018  9:32 AM  PATIENT:  Katrina [name]  32 y.o. female  PRE-OPERATIVE DIAGNOSIS:  Z30.2 Encounter for sterilization  POST-OPERATIVE DIAGNOSIS:  Z30.2 Encounter for sterilization  PROCEDURE:  Procedure(s): LAPAROSCOPIC TUBAL LIGATION (Bilateral)  SURGEON:  Surgeon(s) and Role:    Gerald Leitz* Kristie Bracewell, MD - Primary  PHYSICIAN ASSISTANT:   ASSISTANTS: none   ANESTHESIA:   general  EBL:  5 mL   BLOOD ADMINISTERED:none  DRAINS: none   LOCAL MEDICATIONS USED:  MARCAINE     SPECIMEN:  No Specimen  DISPOSITION OF SPECIMEN:  N/A  COUNTS:  YES  TOURNIQUET:  * No tourniquets in log *  DICTATION: .Dragon Dictation  PLAN OF CARE: Discharge to home after PACU  PATIENT DISPOSITION:  PACU - hemodynamically stable.   Delay start of Pharmacological VTE agent (>24 hrs) due to surgical blood loss or risk of bleeding: not applicable   Findings: normal uterus and cervix. Normal fallopian tubes and ovaries.    Procedure: The patient was taken to the operating room where she was placed under general anesthesia. She is placed in the dorsolithotomy position. She was prepped and draped in the usual sterile fashion. Speculum was placed into the vaginal vault. In the anterior lip of the cervix grasped with a single-tooth tenaculum. A uterine manipulator was placed without difficulty. The single-tooth tenaculum was  Removed. The speculum was removed.  Attention was turned to the umbilicus which was injected with 10 cc of quarter percent Marcaine. A 11 mm incision was made with the scalpel. A 11 mm trocar was placed under direct visualization. Entry into the peritoneum was visualized. The pneumoperitoneum was achieved with CO2 gas. The pelvis was examined her  fallopian tubes and ovaries appeared normal.  Operative scope was inserted. The  Ampullary portion of the Right  fallopian tube was cauterized with the Kleppinger  and then cut with laparoscopic scissors. This was repeated on the  left fallopian tube. A. laparoscopic needle was inserted and 10 cc of quarter percent Marcaine was placed along the ampullary portion of the tubes  bilaterally. Pneumoperitoneum was released. The umbilical trocar was removed under direct visualization. The fascia was closed with 0 Vicryl. The skin was closed with 4-0 Vicryl. The derma bond placed a over the skin incision.  The uterine manipulator was removed. Bleeding was noted at the tenaculum site. Hemostasis was achieved with the application of silver nitrate.   Patient was awakened from anesthesia taken to the recovery room awake and in stable condition.

## 2018-11-20 NOTE — Discharge Instructions (Signed)
°  Post Anesthesia Home Care Instructions  NO ibuprofen until after 0330pm 11/20/18.  Activity: Get plenty of rest for the remainder of the day. A responsible individual must stay with you for 24 hours following the procedure.  For the next 24 hours, DO NOT: -Drive a car -Advertising copywriterperate machinery -Drink alcoholic beverages -Take any medication unless instructed by your physician -Make any legal decisions or sign important papers.  Meals: Start with liquid foods such as gelatin or soup. Progress to regular foods as tolerated. Avoid greasy, spicy, heavy foods. If nausea and/or vomiting occur, drink only clear liquids until the nausea and/or vomiting subsides. Call your physician if vomiting continues.  Special Instructions/Symptoms: Your throat may feel dry or sore from the anesthesia or the breathing tube placed in your throat during surgery. If this causes discomfort, gargle with warm salt water. The discomfort should disappear within 24 hours.  If you had a scopolamine patch placed behind your ear for the management of post- operative nausea and/or vomiting:  1. The medication in the patch is effective for 72 hours, after which it should be removed.  Wrap patch in a tissue and discard in the trash. Wash hands thoroughly with soap and water. 2. You may remove the patch earlier than 72 hours if you experience unpleasant side effects which may include dry mouth, dizziness or visual disturbances. 3. Avoid touching the patch. Wash your hands with soap and water after contact with the patch.

## 2018-11-20 NOTE — Anesthesia Postprocedure Evaluation (Signed)
Anesthesia Post Note  Patient: Katrina [name]  Procedure(s) Performed: LAPAROSCOPIC TUBAL LIGATION (Bilateral Abdomen)     Patient location during evaluation: PACU Anesthesia Type: General Level of consciousness: sedated Pain management: pain level controlled Vital Signs Assessment: post-procedure vital signs reviewed and stable Respiratory status: spontaneous breathing and respiratory function stable Cardiovascular status: stable Postop Assessment: no apparent nausea or vomiting Anesthetic complications: no    Last Vitals:  Vitals:   11/20/18 1030 11/20/18 1037  BP: 114/75   Pulse: 64 62  Resp: 11 17  Temp:    SpO2: 100% 99%    Last Pain:  Vitals:   11/20/18 1037  TempSrc:   PainSc: 2                  Bernadette Gores DANIEL

## 2018-11-20 NOTE — MAU Note (Signed)
Pt reports pain in her right side that shoots into her right shoulder, makes her short of breath. Pt was on her way home from having her tubal ligation.

## 2018-11-20 NOTE — Discharge Instructions (Signed)
Abdominal Bloating °When you have abdominal bloating, your abdomen may feel full, tight, or painful. It may also look bigger than normal or swollen (distended). Common causes of abdominal bloating include: °· Swallowing air. °· Constipation. °· Problems digesting food. °· Eating too much. °· Irritable bowel syndrome. This is a condition that affects the large intestine. °· Lactose intolerance. This is an inability to digest lactose, a natural sugar in dairy products. °· Celiac disease. This is a condition that affects the ability to digest gluten, a protein found in some grains. °· Gastroparesis. This is a condition that slows down the movement of food in the stomach and small intestine. It is more common in people with diabetes mellitus. °· Gastroesophageal reflux disease (GERD). This is a digestive condition that makes stomach acid flow back into the esophagus. °· Urinary retention. This means that the body is holding onto urine, and the bladder cannot be emptied all the way. °Follow these instructions at home: °Eating and drinking °· Avoid eating too much. °· Try not to swallow air while talking or eating. °· Avoid eating while lying down. °· Avoid these foods and drinks: °? Foods that cause gas, such as broccoli, cabbage, cauliflower, and baked beans. °? Carbonated drinks. °? Hard candy. °? Chewing gum. °Medicines °· Take over-the-counter and prescription medicines only as told by your health care provider. °· Take probiotic medicines. These medicines contain live bacteria or yeasts that can help digestion. °· Take coated peppermint oil capsules. °Activity °· Try to exercise regularly. Exercise may help to relieve bloating that is caused by gas and relieve constipation. °General instructions °· Keep all follow-up visits as told by your health care provider. This is important. °Contact a health care provider if: °· You have nausea and vomiting. °· You have diarrhea. °· You have abdominal pain. °· You have unusual  weight loss or weight gain. °· You have severe pain, and medicines do not help. °Get help right away if: °· You have severe chest pain. °· You have trouble breathing. °· You have shortness of breath. °· You have trouble urinating. °· You have darker urine than normal. °· You have blood in your stools or have dark, tarry stools. °Summary °· Abdominal bloating means that the abdomen is swollen. °· Common causes of abdominal bloating are swallowing air, constipation, and problems digesting food. °· Avoid eating too much and avoid swallowing air. °· Avoid foods that cause gas, carbonated drinks, hard candy, and chewing gum. °This information is not intended to replace advice given to you by your health care provider. Make sure you discuss any questions you have with your health care provider. °Document Released: 12/22/2016 Document Revised: 12/22/2016 Document Reviewed: 12/22/2016 °Elsevier Interactive Patient Education © 2019 Elsevier Inc. ° °

## 2018-11-21 ENCOUNTER — Encounter (HOSPITAL_BASED_OUTPATIENT_CLINIC_OR_DEPARTMENT_OTHER): Payer: Self-pay | Admitting: Obstetrics and Gynecology

## 2018-12-17 DIAGNOSIS — J3489 Other specified disorders of nose and nasal sinuses: Secondary | ICD-10-CM | POA: Diagnosis not present

## 2019-01-03 DIAGNOSIS — K091 Developmental (nonodontogenic) cysts of oral region: Secondary | ICD-10-CM | POA: Diagnosis not present

## 2019-01-03 DIAGNOSIS — J343 Hypertrophy of nasal turbinates: Secondary | ICD-10-CM | POA: Diagnosis not present

## 2019-01-17 ENCOUNTER — Other Ambulatory Visit: Payer: Self-pay | Admitting: Otolaryngology

## 2019-01-17 DIAGNOSIS — K091 Developmental (nonodontogenic) cysts of oral region: Secondary | ICD-10-CM

## 2019-01-28 ENCOUNTER — Other Ambulatory Visit: Payer: BLUE CROSS/BLUE SHIELD

## 2019-03-06 DIAGNOSIS — Z01419 Encounter for gynecological examination (general) (routine) without abnormal findings: Secondary | ICD-10-CM | POA: Diagnosis not present

## 2019-12-02 DIAGNOSIS — K091 Developmental (nonodontogenic) cysts of oral region: Secondary | ICD-10-CM | POA: Diagnosis not present

## 2019-12-03 ENCOUNTER — Other Ambulatory Visit: Payer: Self-pay | Admitting: Otolaryngology

## 2019-12-03 DIAGNOSIS — K091 Developmental (nonodontogenic) cysts of oral region: Secondary | ICD-10-CM

## 2019-12-11 ENCOUNTER — Other Ambulatory Visit: Payer: BLUE CROSS/BLUE SHIELD

## 2019-12-11 ENCOUNTER — Other Ambulatory Visit: Payer: Self-pay

## 2019-12-11 ENCOUNTER — Ambulatory Visit
Admission: RE | Admit: 2019-12-11 | Discharge: 2019-12-11 | Disposition: A | Discharge status: Home / Self Care | Payer: BC Managed Care – PPO | Source: Ambulatory Visit | Attending: Otolaryngology | Admitting: Otolaryngology

## 2019-12-11 DIAGNOSIS — K091 Developmental (nonodontogenic) cysts of oral region: Secondary | ICD-10-CM

## 2019-12-11 DIAGNOSIS — K098 Other cysts of oral region, not elsewhere classified: Secondary | ICD-10-CM | POA: Diagnosis not present

## 2019-12-11 MED ORDER — IOPAMIDOL (ISOVUE-300) INJECTION 61%
75.0000 mL | Freq: Once | INTRAVENOUS | Status: AC | PRN
  Administered 2019-12-11: 75 mL via INTRAVENOUS

## 2020-01-01 DIAGNOSIS — N939 Abnormal uterine and vaginal bleeding, unspecified: Secondary | ICD-10-CM | POA: Diagnosis not present

## 2020-01-01 DIAGNOSIS — N926 Irregular menstruation, unspecified: Secondary | ICD-10-CM | POA: Diagnosis not present

## 2020-01-15 DIAGNOSIS — N83201 Unspecified ovarian cyst, right side: Secondary | ICD-10-CM | POA: Diagnosis not present

## 2020-01-15 DIAGNOSIS — N939 Abnormal uterine and vaginal bleeding, unspecified: Secondary | ICD-10-CM | POA: Diagnosis not present

## 2020-03-11 DIAGNOSIS — N83201 Unspecified ovarian cyst, right side: Secondary | ICD-10-CM | POA: Diagnosis not present

## 2020-07-14 DIAGNOSIS — F321 Major depressive disorder, single episode, moderate: Secondary | ICD-10-CM | POA: Diagnosis not present

## 2020-07-14 DIAGNOSIS — F419 Anxiety disorder, unspecified: Secondary | ICD-10-CM | POA: Diagnosis not present

## 2020-08-03 DIAGNOSIS — F419 Anxiety disorder, unspecified: Secondary | ICD-10-CM | POA: Diagnosis not present

## 2020-08-03 DIAGNOSIS — F321 Major depressive disorder, single episode, moderate: Secondary | ICD-10-CM | POA: Diagnosis not present

## 2020-09-12 DIAGNOSIS — R8290 Unspecified abnormal findings in urine: Secondary | ICD-10-CM | POA: Diagnosis not present

## 2020-09-12 DIAGNOSIS — M545 Low back pain, unspecified: Secondary | ICD-10-CM | POA: Diagnosis not present

## 2020-09-12 DIAGNOSIS — R3915 Urgency of urination: Secondary | ICD-10-CM | POA: Diagnosis not present

## 2021-10-18 ENCOUNTER — Encounter (HOSPITAL_BASED_OUTPATIENT_CLINIC_OR_DEPARTMENT_OTHER): Payer: Self-pay

## 2021-10-18 ENCOUNTER — Other Ambulatory Visit: Payer: Self-pay

## 2021-10-18 ENCOUNTER — Emergency Department (HOSPITAL_BASED_OUTPATIENT_CLINIC_OR_DEPARTMENT_OTHER)
Admission: EM | Admit: 2021-10-18 | Discharge: 2021-10-18 | Disposition: A | Discharge status: Home / Self Care | Payer: BC Managed Care – PPO | Attending: Emergency Medicine | Admitting: Emergency Medicine

## 2021-10-18 DIAGNOSIS — Z23 Encounter for immunization: Secondary | ICD-10-CM | POA: Diagnosis not present

## 2021-10-18 DIAGNOSIS — W260XXA Contact with knife, initial encounter: Secondary | ICD-10-CM | POA: Diagnosis not present

## 2021-10-18 DIAGNOSIS — S61210A Laceration without foreign body of right index finger without damage to nail, initial encounter: Secondary | ICD-10-CM | POA: Insufficient documentation

## 2021-10-18 MED ORDER — TETANUS-DIPHTH-ACELL PERTUSSIS 5-2.5-18.5 LF-MCG/0.5 IM SUSY
0.5000 mL | PREFILLED_SYRINGE | Freq: Once | INTRAMUSCULAR | Status: AC
  Administered 2021-10-18: 0.5 mL via INTRAMUSCULAR
  Filled 2021-10-18: qty 0.5

## 2021-10-18 NOTE — ED Triage Notes (Signed)
Patient reports cutting first finger on right hand with kitchen knife. Cut is approx. 0.5". Bleeding is controlled in triage.

## 2021-10-18 NOTE — Discharge Instructions (Signed)
It was our pleasure to provide your ER care today - we hope that you feel better.  Keep area very clean/dry. Have sutures removed, your doctor or urgent care, in 9-10 days.   Take acetaminophen or ibuprofen as need.   Return to ER if worse, new symptoms, infection of wound, spreading redness, pus from wound, severe pain, or other concern.

## 2021-10-18 NOTE — ED Provider Notes (Signed)
[facility] EMERGENCY DEPT Provider Note   CSN: [id number] Arrival date & time: 10/18/21  1934     History Chief Complaint  Patient presents with   Laceration    Katrina [name] is a 35 y.o. female.  Pt with accidental cut to volar aspect right index finger just pta today. Accidentally cut with kitchen knife. Minor bleeding stopped w pressure. Mild pain to area. No numbness or limitation of rom. On other injury. Last tetanus unknown. Is left hand dominant.   The history is provided by the patient and medical records.  Laceration Associated symptoms: no fever       Past Medical History:  Diagnosis Date   Borderline anemia    takes iron once a day    Patient Active Problem List   Diagnosis Date Noted   Postpartum anemia 08/11/2018   Normal postpartum course 08/11/2018   Normal labor 08/10/2018   Surrogate pregnancy 08/10/2018   Chronic migraine without aura without status migrainosus, not intractable 09/02/2017   Vaginal delivery 03/20/2015    Past Surgical History:  Procedure Laterality Date   APPENDECTOMY     LAPAROSCOPIC TUBAL LIGATION Bilateral 11/20/2018   Procedure: LAPAROSCOPIC TUBAL LIGATION;  Surgeon: Gerald Leitz, MD;  Location: [facility];  Service: Gynecology;  Laterality: Bilateral;   TUBAL LIGATION       OB History     Gravida  3   Para  3   Term  3   Preterm      AB      Living  3      SAB      IAB      Ectopic      Multiple  0   Live Births  3           Family History  Problem Relation Age of Onset   Cancer Maternal Grandfather     Social History   Tobacco Use   Smoking status: Never   Smokeless tobacco: Never  Substance Use Topics   Alcohol use: No   Drug use: No    Home Medications Prior to Admission medications   Medication Sig Start Date End Date Taking? Authorizing Provider  ferrous sulfate 325 (65 FE) MG tablet Take 1 tablet (325 mg total) by mouth 2 (two) times daily  with a meal. Patient taking differently: Take 325 mg by mouth daily with breakfast.  08/11/18 08/11/19  Dale [place], FNP  HYDROcodone-acetaminophen (NORCO/VICODIN) 5-325 MG tablet Take 1 tablet by mouth every 6 (six) hours as needed for moderate pain. Patient not taking: Reported on 11/20/2018 11/20/18   Gerald Leitz, MD  ibuprofen (ADVIL,MOTRIN) 800 MG tablet Take 1 tablet (800 mg total) by mouth every 8 (eight) hours as needed. Patient not taking: Reported on 11/20/2018 11/20/18   Gerald Leitz, MD  norethindrone-ethinyl estradiol-iron (ESTROSTEP FE,TILIA FE,TRI-LEGEST FE) 1-20/1-30/1-35 MG-MCG tablet Take 1 tablet by mouth daily.    [provider]    Allergies    Patient has no known allergies.  Review of Systems   Review of Systems  Constitutional:  Negative for fever.  Musculoskeletal:        Lac to finger  Skin:  Positive for wound.  Neurological:  Negative for numbness.   Physical Exam Updated Vital Signs BP 106/76   Pulse 72   Temp 98.4 F (36.9 C)   Resp 16   Ht 1.524 m (5')   Wt 45.4 kg   SpO2 98%   BMI 19.53  kg/m   Physical Exam Vitals and nursing note reviewed.  Constitutional:      Appearance: Normal appearance. She is well-developed.  HENT:     Head: Atraumatic.     Nose: Nose normal.     Mouth/Throat:     Mouth: Mucous membranes are moist.  Eyes:     General: No scleral icterus.    Conjunctiva/sclera: Conjunctivae normal.  Neck:     Trachea: No tracheal deviation.  Cardiovascular:     Rate and Rhythm: Normal rate.     Pulses: Normal pulses.  Pulmonary:     Effort: Pulmonary effort is normal. No respiratory distress.  Genitourinary:    Comments: No cva tenderness.  Musculoskeletal:     Cervical back: Neck supple. No muscular tenderness.     Comments: ~ 8 mm laceration to volar aspect right index finger pip region. Flexor tendon fxn intact. Normal cap refill distally. No fb seen or felt.   Skin:    General: Skin is warm and dry.     Findings:  No rash.  Neurological:     Mental Status: She is alert.     Comments: Alert, speech normal. Right index finger nvi.   Psychiatric:        Mood and Affect: Mood normal.    ED Results / Procedures / Treatments   Labs (all labs ordered are listed, but only abnormal results are displayed) Labs Reviewed - No data to display  EKG None  Radiology No results found.  Procedures .Marland KitchenLaceration Repair  Date/Time: 10/18/2021 8:05 PM Performed by: Cathren Laine, MD Authorized by: Cathren Laine, MD   Consent:    Consent given by:  Patient Anesthesia:    Anesthesia method:  Local infiltration   Local anesthetic:  Lidocaine 2% WITH epi (< 1 cc used) Laceration details:    Location:  Finger   Finger location:  R index finger   Length (cm):  0.8 Pre-procedure details:    Preparation:  Patient was prepped and draped in usual sterile fashion Exploration:    Wound exploration: wound explored through full range of motion and entire depth of wound visualized     Contaminated: no   Treatment:    Area cleansed with:  Saline   Amount of cleaning:  Standard   Irrigation solution:  Sterile saline   Visualized foreign bodies/material removed: no   Skin repair:    Repair method:  Sutures   Suture size:  4-0   Suture material:  Prolene   Number of sutures:  2 Repair type:    Repair type:  Simple Post-procedure details:    Dressing:  Sterile dressing   Procedure completion:  Tolerated well, no immediate complications   Medications Ordered in ED Medications  Tdap (BOOSTRIX) injection 0.5 mL (has no administration in time range)    ED Course  I have reviewed the triage vital signs and the nursing notes.  Pertinent labs & imaging results that were available during my care of the patient were reviewed by me and considered in my medical decision making (see chart for details).    MDM Rules/Calculators/A&P                          Wound repaired. Sterile dressing. Tetanus im.   Pt  appears stable for d/c.    Final Clinical Impression(s) / ED Diagnoses Final diagnoses:  None    Rx / DC Orders ED Discharge Orders  None        Cathren Laine, MD 10/18/21 2006

## 2021-11-05 IMAGING — CT CT MAXILLOFACIAL W/ CM
3 of 6 series · 15 of 47 positions shown, 18 images · IV contrast (iopamidol)
Comparison: Head CT 02/22/2011.

CLINICAL DATA: 33-year-old female with right nasopalatine cyst.
Foul smelling drainage, completed antibiotics.

EXAM:
CT MAXILLOFACIAL WITH CONTRAST
TECHNIQUE: Multidetector CT imaging of the maxillofacial structures was
performed with intravenous contrast. Multiplanar CT image
reconstructions were also generated.
CONTRAST:  75mL IKGXUL-ADD IOPAMIDOL (IKGXUL-ADD) INJECTION 61%

[Series 2: maxillofacial 2.00 hr60 s3 axial · axial · 0.30mm/px · z∈[-646,-526]mm · 10 of 71 slices shown, 13 images]
[im 6/71  brain]
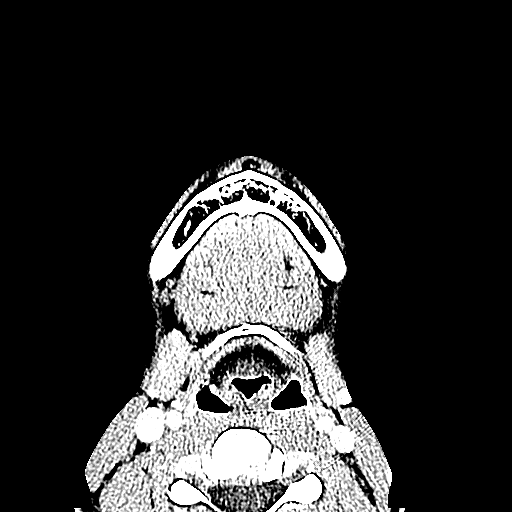
[im 6/71  bone]
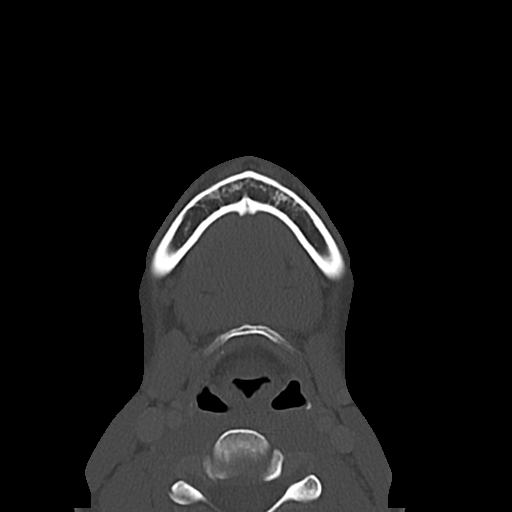
[im 11/71  bone]
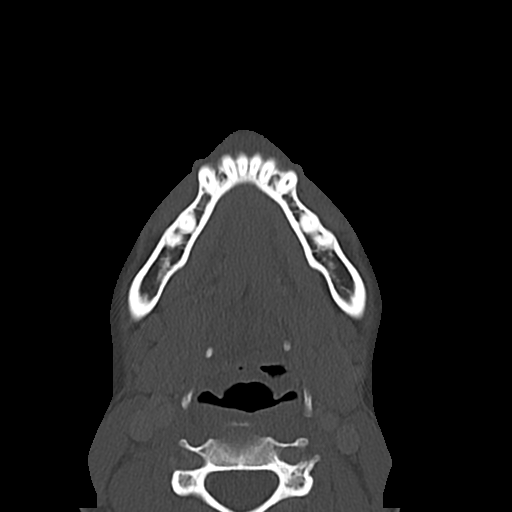
[im 21/71  bone]
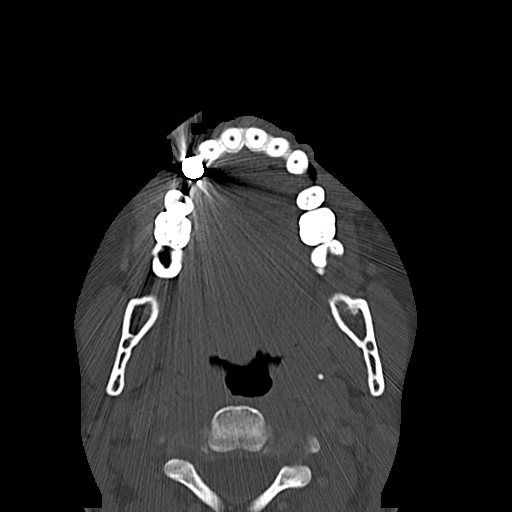
[im 26/71  bone]
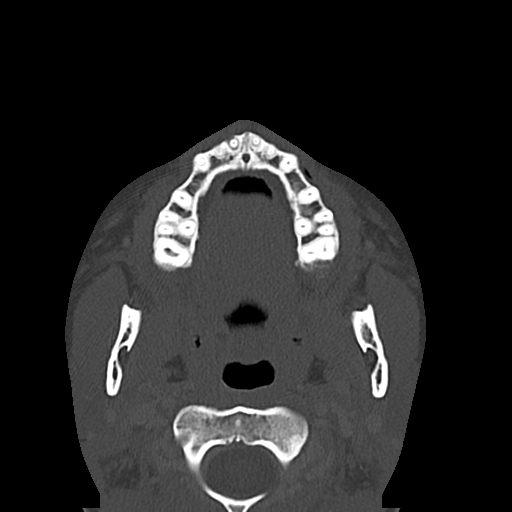
[im 31/71  brain]
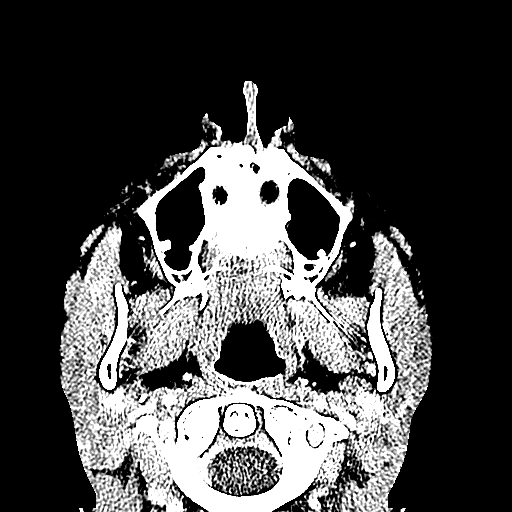
[im 31/71  bone]
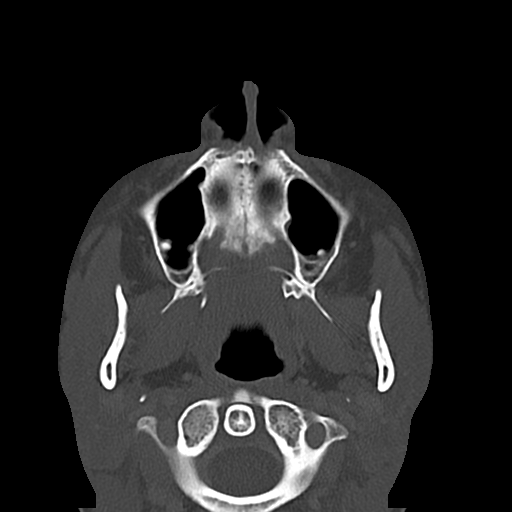
[im 41/71  bone]
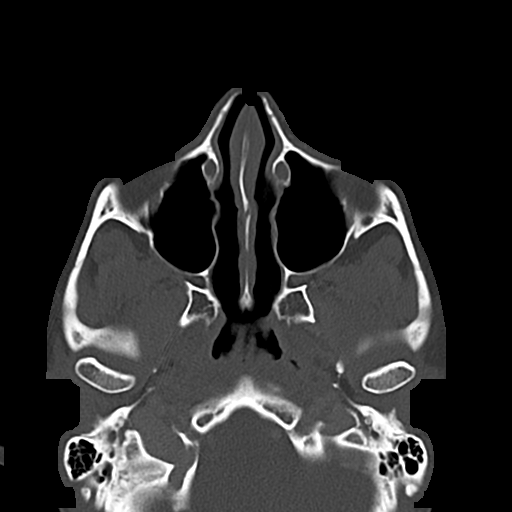
[im 46/71  bone]
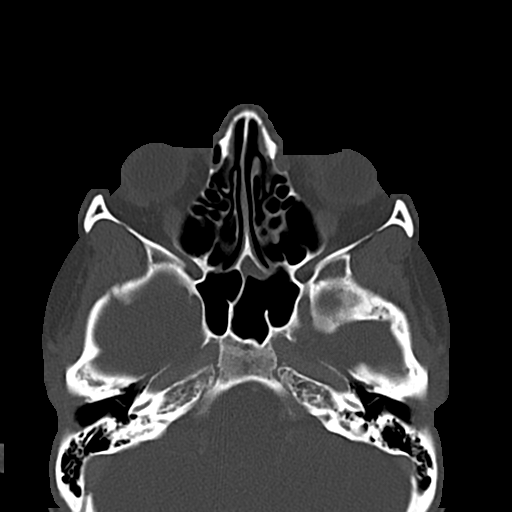
[im 51/71  bone]
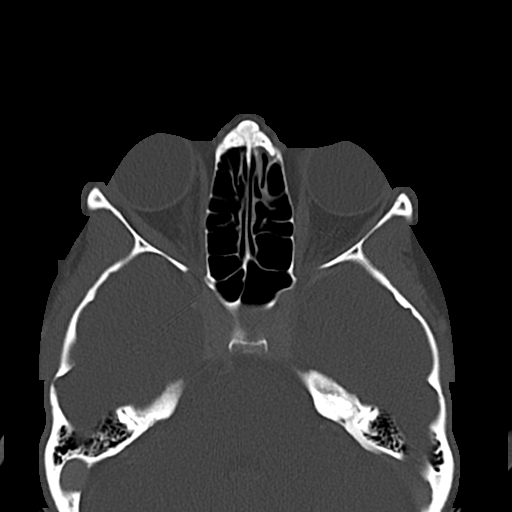
[im 61/71  brain]
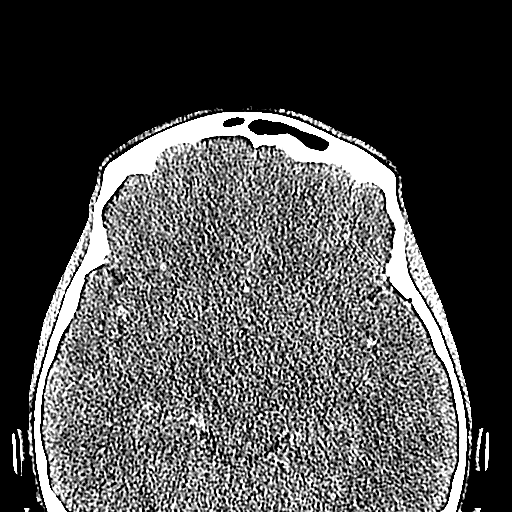
[im 61/71  bone]
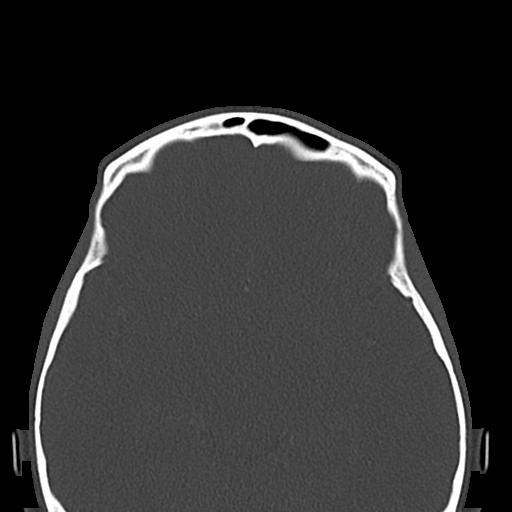
[im 66/71  bone]
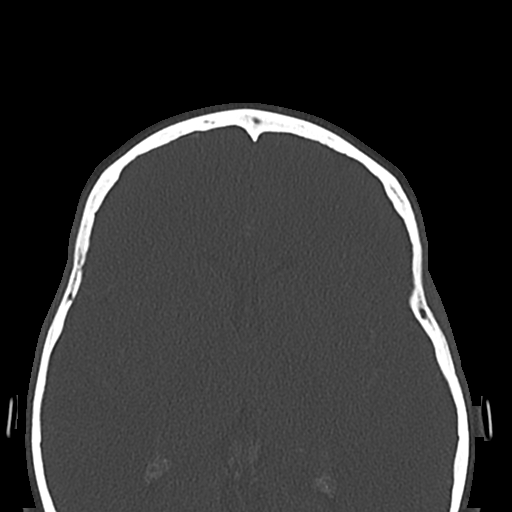

[Series 6: maxillofacial 2.00 hr60 s3 cor · coronal · 0.28mm/px · 3 of 78 slices shown]
[im 20/78  bone]
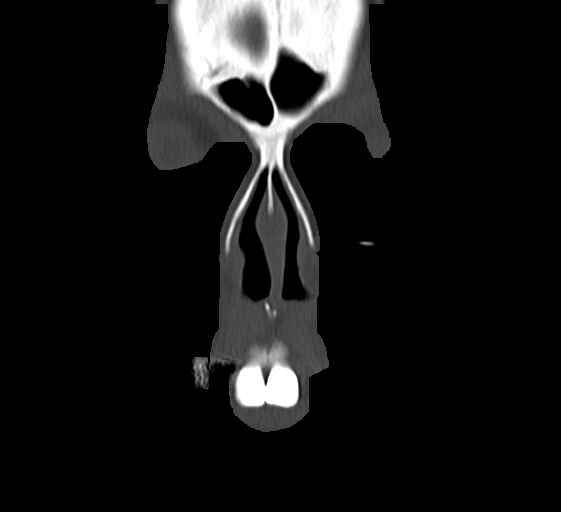
[im 39/78  bone]
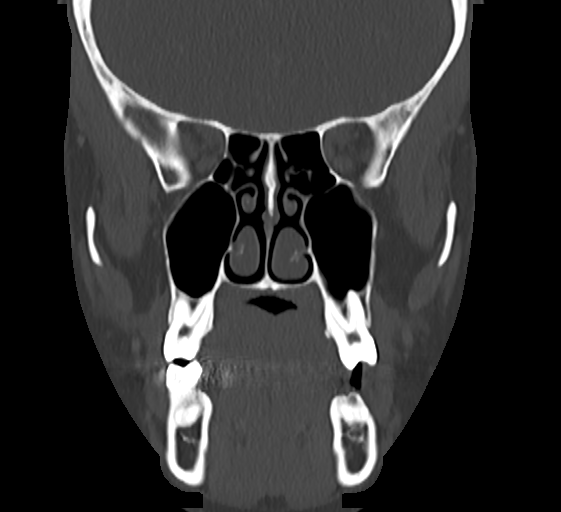
[im 58/78  bone]
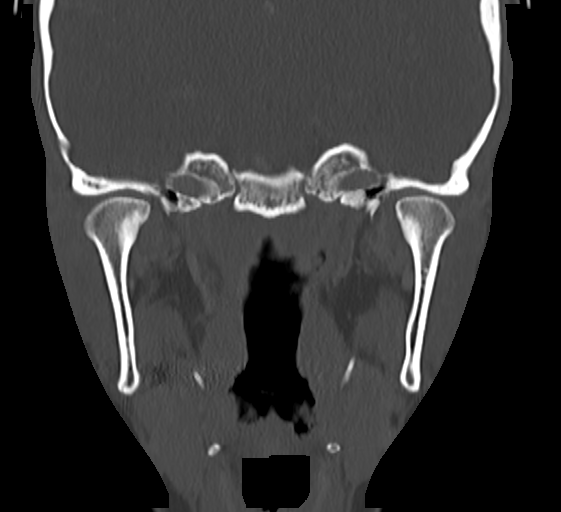

[Series 12: maxillofacial 2.00 hr40 s3 sag · sagittal · 0.28mm/px · 2 of 78 slices shown]
[im 26/78  bone]
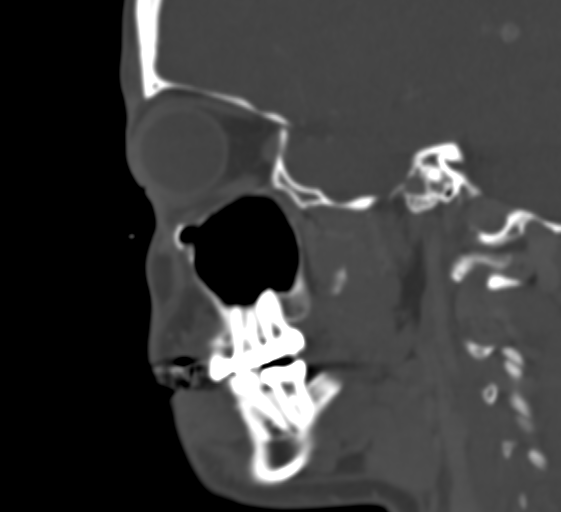
[im 52/78  bone]
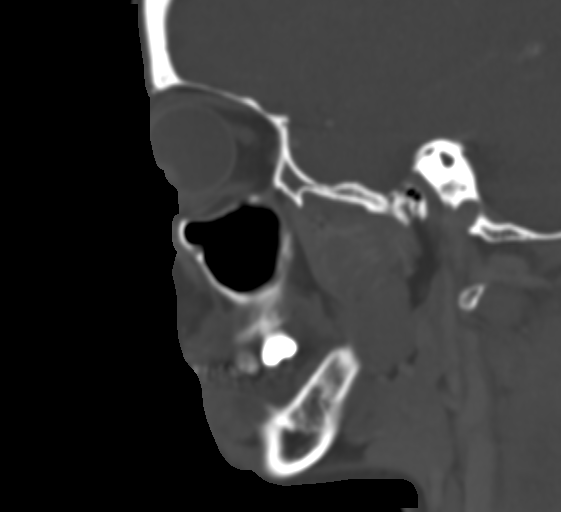

[15 of 47 positions shown; findings below may reference images not displayed]

FINDINGS: Osseous: Mandible intact and normally located. Mandible dentition
within normal limits.

Maxillary dentition remarkable for periapical lucency at the medial
right maxillary incisor with overlying cortical breakthrough (series
2, image 45 and series 6, image 22. This is near the right nasal
labial fold, although no regional soft tissue fluid collection or
asymmetric soft tissue thickening is noted.

There is nearby mild prominence of the hard palate incisive foramen
(series 2, image 47 and series 6, image 24) which does not obviously
communicate with the right medial maxillary incisor finding. And
there is only subtle evidence of a soft tissue cyst corresponding to
the foramen measuring 2-3 millimeters on series 3, image 48.

The remaining hard palate and soft palate appear negative by CT.

No other acute dental finding and other facial bones appear intact.
Central skull base intact. Visible cervical vertebrae intact.

Orbits: Intact orbital walls. Rightward gaze, otherwise normal
orbits soft tissues.

Sinuses: Clear aside from mild mucosal thickening along the
anteromedial wall the left sphenoid sinus (series 2, image 24).
There is mild leftward nasal septal deviation and mild symmetric
nasal cavity mucosal thickening. Pneumatized left middle concha
bullosa and both olfactory recesses. Tympanic cavities and mastoids
are clear.

Soft tissues: Negative visible larynx, pharynx, parapharyngeal
spaces, retropharyngeal space, sublingual space, submandibular
glands, parotid glands, and masticator spaces.

The visible major vascular structures in the neck and at the skull
base appear patent.

No upper cervical lymphadenopathy.

Limited intracranial: Negative.
IMPRESSION: 1. Subtle 2-3 mm cyst of the midline hard palate incisive foramen.
No regional inflammation is evident, but this is in proximity to -
but not directly communicating with - inflammatory periapical
lucency of the medial right maxillary incisor.
2. Otherwise negative Face CT.

## 2022-06-23 ENCOUNTER — Encounter: Payer: Self-pay | Admitting: Physician Assistant

## 2022-06-23 ENCOUNTER — Ambulatory Visit (INDEPENDENT_AMBULATORY_CARE_PROVIDER_SITE_OTHER): Payer: BC Managed Care – PPO | Admitting: Physician Assistant

## 2022-06-23 VITALS — BP 107/66 | HR 64 | Ht 60.0 in | Wt 110.0 lb

## 2022-06-23 DIAGNOSIS — G4489 Other headache syndrome: Secondary | ICD-10-CM | POA: Diagnosis not present

## 2022-06-23 DIAGNOSIS — M62838 Other muscle spasm: Secondary | ICD-10-CM | POA: Diagnosis not present

## 2022-06-23 DIAGNOSIS — G43709 Chronic migraine without aura, not intractable, without status migrainosus: Secondary | ICD-10-CM | POA: Diagnosis not present

## 2022-06-23 MED ORDER — METOPROLOL TARTRATE 25 MG PO TABS: 25.0000 mg | ORAL_TABLET | Freq: Every day | ORAL | 3 refills | Status: AC

## 2022-06-23 MED ORDER — CYCLOBENZAPRINE HCL 10 MG PO TABS: 10.0000 mg | ORAL_TABLET | Freq: Three times a day (TID) | ORAL | 2 refills | Status: AC | PRN

## 2022-06-23 MED ORDER — NAPROXEN 500 MG PO TABS: 500.0000 mg | ORAL_TABLET | Freq: Two times a day (BID) | ORAL | 1 refills | Status: AC

## 2022-06-23 MED ORDER — SUMATRIPTAN SUCCINATE 100 MG PO TABS: 100.0000 mg | ORAL_TABLET | Freq: Once | ORAL | 3 refills | Status: AC | PRN

## 2022-06-23 NOTE — Progress Notes (Signed)
Patient presents for management of HA.  Pt takes Excedrin for Relief but it does not help take away headache. HA's are  Always around cycle time will last 5 days or more.   History:  Katrina [name] is a 36 y.o. [id number] who presents to clinic today for headache management.  She has not been seen in several years.  She has never used any medication to prevent headaches.  They are definitely related to her menstrual cycle.  She had a period of several months during which she did not have periods.  Migraines are more severe now and not responsive to excedrin anymore.  The pain gets to be severe, located bilaterally in the temples/behind eyes or back of head/neck.  There is throbbing and it is worse with position changes.  Worse with lights/noises.  There is nausea.  It lasts all day and sometimes continuous for several days.    HIT6:62 Number of days in the last 4 weeks with:  Severe headache: 5 Moderate headache: 3 Mild headache: 10  No headache: 10   Past Medical History:  Diagnosis Date   Borderline anemia    takes iron once a day    Social History   Socioeconomic History   Marital status: Married    Spouse name: Not on file   Number of children: Not on file   Years of education: Not on file   Highest education level: Not on file  Occupational History   Not on file  Tobacco Use   Smoking status: Never   Smokeless tobacco: Never  Substance and Sexual Activity   Alcohol use: No   Drug use: No   Sexual activity: Not Currently    Birth control/protection: None  Other Topics Concern   Not on file  Social History Narrative   Not on file   Social Determinants of Health   Financial Resource Strain: Not on file  Food Insecurity: Not on file  Transportation Needs: Not on file  Physical Activity: Not on file  Stress: Not on file  Social Connections: Not on file  Intimate Partner Violence: Not on file    Family History  Problem Relation Age of Onset   Cancer Maternal  Grandfather     No Known Allergies  Current Outpatient Medications on File Prior to Visit  Medication Sig Dispense Refill   ferrous sulfate 325 (65 FE) MG tablet Take 1 tablet (325 mg total) by mouth 2 (two) times daily with a meal. (Patient taking differently: Take 325 mg by mouth daily with breakfast. ) 30 tablet 3   HYDROcodone-acetaminophen (NORCO/VICODIN) 5-325 MG tablet Take 1 tablet by mouth every 6 (six) hours as needed for moderate pain. (Patient not taking: Reported on 11/20/2018) 20 tablet 0   ibuprofen (ADVIL,MOTRIN) 800 MG tablet Take 1 tablet (800 mg total) by mouth every 8 (eight) hours as needed. (Patient not taking: Reported on 11/20/2018) 30 tablet 0   norethindrone-ethinyl estradiol-iron (ESTROSTEP FE,TILIA FE,TRI-LEGEST FE) 1-20/1-30/1-35 MG-MCG tablet Take 1 tablet by mouth daily. (Patient not taking: Reported on 06/23/2022)     No current facility-administered medications on file prior to visit.     Review of Systems:  All pertinent positive/negative included in HPI, all other review of systems are negative   Objective:  Physical Exam BP 107/66   Pulse 64   Ht 5' (1.524 m)   Wt 110 lb (49.9 kg)   LMP 06/17/2022   Breastfeeding No   BMI 21.48 kg/m  CONSTITUTIONAL: Well-developed, well-nourished  female in no acute distress.  EYES: EOM intact ENT: Normocephalic CARDIOVASCULAR: Regular rate   RESPIRATORY: Normal rate MUSCULOSKELETAL: Normal ROM SKIN: Warm, dry without erythema  NEUROLOGICAL: Alert, oriented, CN II-XII grossly intact, Appropriate balance PSYCH: Normal behavior, mood   Assessment & Plan:  Assessment: 1. Chronic migraine without aura without status migrainosus, not intractable   2. Headache associated with hormonal factors       Plan: Metoprolol 25mg  - use 1/2 tab for a week and then 1 daily for migraine prevention. Pt states she has annual exam with pcp next month - will hold off on checking thyroid assuming that will happen during  this visit.   Cyclobenzaprine 10mg  - use 1/2 tab in evening to help decrease baseline muscle tension - use regularly for 1-2 weeks and then as needed.  Sumatriptan - use for acute migraine = use early - may repeat after 2 hours if needed.  Limit of 2/day and 2 days/week Naprosyn - use before or with sumatriptan for acute headache to improve efficacy   Follow-up in 3 months or sooner PRN  , PA-C 06/23/2022 9:45 AM

## 2022-06-23 NOTE — Patient Instructions (Signed)
Migraine Headache ?A migraine headache is a very strong throbbing pain on one side or both sides of your head. This type of headache can also cause other symptoms. It can last from 4 hours to 3 days. Talk with your doctor about what things may bring on (trigger) this condition. ?What are the causes? ?The exact cause of this condition is not known. This condition may be triggered or caused by: ?Drinking alcohol. ?Smoking. ?Taking medicines, such as: ?Medicine used to treat chest pain (nitroglycerin). ?Birth control pills. ?Estrogen. ?Some blood pressure medicines. ?Eating or drinking certain products. ?Doing physical activity. ?Other things that may trigger a migraine headache include: ?Having a menstrual period. ?Pregnancy. ?Hunger. ?Stress. ?Not getting enough sleep or getting too much sleep. ?Weather changes. ?Tiredness (fatigue). ?What increases the risk? ?Being 25-55 years old. ?Being female. ?Having a family history of migraine headaches. ?Being Caucasian. ?Having depression or anxiety. ?Being very overweight. ?What are the signs or symptoms? ?A throbbing pain. This pain may: ?Happen in any area of the head, such as on one side or both sides. ?Make it hard to do daily activities. ?Get worse with physical activity. ?Get worse around bright lights or loud noises. ?Other symptoms may include: ?Feeling sick to your stomach (nauseous). ?Vomiting. ?Dizziness. ?Being sensitive to bright lights, loud noises, or smells. ?Before you get a migraine headache, you may get warning signs (an aura). An aura may include: ?Seeing flashing lights or having blind spots. ?Seeing bright spots, halos, or zigzag lines. ?Having tunnel vision or blurred vision. ?Having numbness or a tingling feeling. ?Having trouble talking. ?Having weak muscles. ?Some people have symptoms after a migraine headache (postdromal phase), such as: ?Tiredness. ?Trouble thinking (concentrating). ?How is this treated? ?Taking medicines that: ?Relieve  pain. ?Relieve the feeling of being sick to your stomach. ?Prevent migraine headaches. ?Treatment may also include: ?Having acupuncture. ?Avoiding foods that bring on migraine headaches. ?Learning ways to control your body functions (biofeedback). ?Therapy to help you know and deal with negative thoughts (cognitive behavioral therapy). ?Follow these instructions at home: ?Medicines ?Take over-the-counter and prescription medicines only as told by your doctor. ?Ask your doctor if the medicine prescribed to you: ?Requires you to avoid driving or using heavy machinery. ?Can cause trouble pooping (constipation). You may need to take these steps to prevent or treat trouble pooping: ?Drink enough fluid to keep your pee (urine) pale yellow. ?Take over-the-counter or prescription medicines. ?Eat foods that are high in fiber. These include beans, whole grains, and fresh fruits and vegetables. ?Limit foods that are high in fat and sugar. These include fried or sweet foods. ?Lifestyle ?Do not drink alcohol. ?Do not use any products that contain nicotine or tobacco, such as cigarettes, e-cigarettes, and chewing tobacco. If you need help quitting, ask your doctor. ?Get at least 8 hours of sleep every night. ?Limit and deal with stress. ?General instructions ? ?  ? ?Keep a journal to find out what may bring on your migraine headaches. For example, write down: ?What you eat and drink. ?How much sleep you get. ?Any change in what you eat or drink. ?Any change in your medicines. ?If you have a migraine headache: ?Avoid things that make your symptoms worse, such as bright lights. ?It may help to lie down in a dark, quiet room. ?Do not drive or use heavy machinery. ?Ask your doctor what activities are safe for you. ?Keep all follow-up visits as told by your doctor. This is important. ?Contact a doctor if: ?You get a migraine   headache that is different or worse than others you have had. ?You have more than 15 headache days in one  month. ?Get help right away if: ?Your migraine headache gets very bad. ?Your migraine headache lasts longer than 72 hours. ?You have a fever. ?You have a stiff neck. ?You have trouble seeing. ?Your muscles feel weak or like you cannot control them. ?You start to lose your balance a lot. ?You start to have trouble walking. ?You pass out (faint). ?You have a seizure. ?Summary ?A migraine headache is a very strong throbbing pain on one side or both sides of your head. These headaches can also cause other symptoms. ?This condition may be treated with medicines and changes to your lifestyle. ?Keep a journal to find out what may bring on your migraine headaches. ?Contact a doctor if you get a migraine headache that is different or worse than others you have had. ?Contact your doctor if you have more than 15 headache days in a month. ?This information is not intended to replace advice given to you by your health care provider. Make sure you discuss any questions you have with your health care provider. ?Document Revised: 03/14/2019 Document Reviewed: 01/02/2019 ?Elsevier Patient Education ? 2023 Elsevier Inc[employer] ? ?

## 2022-07-21 ENCOUNTER — Telehealth: Payer: Self-pay | Admitting: Physician Assistant

## 2022-07-21 NOTE — Telephone Encounter (Signed)
Called pt after reviewing Access Nurse Record reporting side effects from Sumatriptan vs possible reaction.   No answer.  Left message.

## 2022-08-16 ENCOUNTER — Telehealth: Payer: Self-pay | Admitting: Physician Assistant

## 2022-08-16 NOTE — Telephone Encounter (Signed)
Pt notes bad reaction to sumatriptan and won't be using this medication any more.  Also - it was ineffective at reducing symptoms of migraine.   She has continued to use metoprolol and flexeril.  She believes these medications are contributing to new symptoms.  She has dry mouth, constipation and blood in her stool when it passes.  She is feeling lightheaded or having to pause when first moving after being still.  She is also feeling depressed which she only experienced previously as post partum depression (7 years ago).   She does note improvement with the prevention of migraine on current regimen.   Pt will discontinue use of flexeril.  She will break metoprolol in half and continue to use at least until her pcp visit (and labs) on 9/18.  After getting labs, we can reassess.  I am thinking switching to atenolol may be helpful but will hold off until after labs.  HOWEVER, pt advised to call back if she is needing to make further change due to symptoms.

## 2022-08-23 ENCOUNTER — Telehealth: Payer: Self-pay | Admitting: Physician Assistant

## 2022-08-23 DIAGNOSIS — G43719 Chronic migraine without aura, intractable, without status migrainosus: Secondary | ICD-10-CM

## 2022-08-23 NOTE — Telephone Encounter (Signed)
Pt reports continued issue with headache but with new characteristic.  Any time she moves or does something quickly or changes position, she has an "excruciating "pain in her head that lasts around 10 seconds.  It has been happening daily for over one week.  She discontinued flexeril and decreased metoprolol by half.  That improved other symptoms but no improvement in this new HA characteristic.  Pt requesting imaging.

## 2022-08-25 DIAGNOSIS — R5383 Other fatigue: Secondary | ICD-10-CM | POA: Diagnosis not present

## 2022-08-25 DIAGNOSIS — G43709 Chronic migraine without aura, not intractable, without status migrainosus: Secondary | ICD-10-CM | POA: Diagnosis not present

## 2022-08-25 DIAGNOSIS — F331 Major depressive disorder, recurrent, moderate: Secondary | ICD-10-CM | POA: Diagnosis not present

## 2022-09-04 ENCOUNTER — Ambulatory Visit
Admission: RE | Admit: 2022-09-04 | Discharge: 2022-09-04 | Disposition: A | Discharge status: Home / Self Care | Payer: BC Managed Care – PPO | Source: Ambulatory Visit | Attending: Physician Assistant | Admitting: Physician Assistant

## 2022-09-04 MED ORDER — GADOPICLENOL 0.5 MMOL/ML IV SOLN
10.0000 mL | Freq: Once | INTRAVENOUS | Status: AC | PRN
  Administered 2022-09-04: 5 mL via INTRAVENOUS

## 2022-09-08 ENCOUNTER — Encounter: Payer: Self-pay | Admitting: Physician Assistant

## 2022-09-08 ENCOUNTER — Ambulatory Visit: Payer: BC Managed Care – PPO | Admitting: Physician Assistant

## 2022-09-08 VITALS — BP 106/71 | HR 69 | Ht 60.0 in | Wt 111.0 lb

## 2022-09-08 DIAGNOSIS — G935 Compression of brain: Secondary | ICD-10-CM | POA: Diagnosis not present

## 2022-09-08 DIAGNOSIS — R519 Headache, unspecified: Secondary | ICD-10-CM | POA: Diagnosis not present

## 2022-09-08 MED ORDER — PREDNISONE 10 MG PO TABS: 10.0000 mg | ORAL_TABLET | Freq: Every day | ORAL | 0 refills | Status: AC

## 2022-09-08 NOTE — Patient Instructions (Signed)
Chiari Malformation  Chiari malformation (CM) is a type of brain abnormality that affects the parts of the brain called the cerebellum and the brain stem. The cerebellum is important for balance, and the brain stem is important for basic body functions, such as breathing and swallowing. Normally, the cerebellum is located in a space at the back of the skull, just above the opening in the skull (foramen magnum)where the spinal cord meets the brain stem. With CM, part of the cerebellum is located below the foramen magnum instead. The malformation can be mild with no or few symptoms, or it can be severe. CM can cause neck pain, headaches, balance problems, and other symptoms. What are the causes? CM is a condition that a person is born with (congenital). In rare cases, CM may also develop later in life, which is called acquired CM or secondary CM. These cases may be caused by a leak of the fluid that surrounds and protects the brain and spinal cord (cerebrospinal fluid), leading to low pressure. In acquired or secondary CM, abnormal pressure develops in the brain. This pushes the cerebellum down into the foramen magnum. What increases the risk? The following factors may make you more likely to develop this condition: Being female. Having a family history of CM. What are the signs or symptoms? Symptoms of this condition may vary depending on the severity of your CM. In some cases, there are no symptoms. In other cases, symptoms may come and go. The most common symptom is a severe headache in the back of the head. The headache: May come and go. May spread to your neck and shoulders. May be worse when you cough, sneeze, or strain. Other symptoms include: Difficulty balancing or loss of coordination. Vision problems, such as double vision, tiny spots moving across your vision (floaters), or sensitivity to lights. Trouble swallowing or speaking or hearing. Feeling dizzy or fainting. Breathing problems,  including breathing pauses during sleep (sleep apnea). Curved back (scoliosis). Inability to control when you urinate (incontinence). How is this diagnosed? This condition may be evaluated with a medical history and physical exam. This may include tests to check your balance and nerves (neurological exam). You may also have imaging tests, such as a CT scan or an MRI. How is this treated? Treatment for this condition depends on the severity of your symptoms. You may be treated with: Surgery to prevent the malformation from getting worse, or to treat severe symptoms or symptoms that are getting worse. Medicines or alternative treatments to relieve headaches or neck pain. If you do not have symptoms, you may not need treatment. Follow these instructions at home: Medicines Take over-the-counter and prescription medicines only as told by your health care provider. Ask your health care provider if the medicine prescribed to you requires you to avoid driving or using machinery. General instructions If you feel like you might faint: Lie down right away and raise (elevate) your feet above the level of your heart. Breathe deeply and steadily. Wait until all of the symptoms have passed. If you have problems with dizziness, get up slowly when lying down. Take several minutes to sit and then stand. Ask your health care provider which activities are safe for you and if you have any activity restrictions. Do not use any products that contain nicotine or tobacco. These products include cigarettes, chewing tobacco, and vaping devices, such as e-cigarettes. If you need help quitting, ask your health care provider. Drink enough fluid to keep your urine pale yellow.  Consider joining a CM support group. Keep all follow-up visits. This is important. Where to find more information [employer] of Neurological Disorders and Stroke: [employer] Contact a health care provider if: You have new  symptoms. Your symptoms get worse. Get help right away if: You develop weakness or numbness in one or more of your limbs. You develop dizziness, slurred speech, double vision, weakness, or numbness with a severe headache. These symptoms may represent a serious problem that is an emergency. Do not wait to see if the symptoms will go away. Get medical help right away. Call your local emergency services (911 in the [place] Summary A Chiari malformation is a condition in which part of the cerebellum moves down through the foramen magnum. The malformation can be mild with no symptoms, or it can be severe. In some cases, no treatment is needed. In others, medicines are used to treat headaches. Surgery is done in the worst of cases. This information is not intended to replace advice given to you by your health care provider. Make sure you discuss any questions you have with your health care provider. Document Revised: 02/15/2021 Document Reviewed: 02/15/2021 Elsevier Patient Education  Katrina [name].

## 2022-09-08 NOTE — Progress Notes (Signed)
Patient here to F/U on  HA's.   Pt states she has HA's everyday.  Pt states she does not have a HA today.   History:  Katrina [name] is a 36 y.o. [phone] who presents to clinic today for discussion of MRI results.  Her MRI was earlier this week on 10/2.  She continues to have daily headache with severe pain anytime she has sudden movement/change in position,  cheers for kids, etc.  She is also having dizziness and concerns with coordination.  She notes overall improvement with muscle relaxant helping her neck and body feel better with muscle spasm but no improvement with head pain.  She is using metoprolol but not sure it is helping.   Her constipation, blood in stool, depression are gone.    Past Medical History:  Diagnosis Date   Borderline anemia    takes iron once a day    Social History   Socioeconomic History   Marital status: Married    Spouse name: Not on file   Number of children: Not on file   Years of education: Not on file   Highest education level: Not on file  Occupational History   Not on file  Tobacco Use   Smoking status: Never   Smokeless tobacco: Never  Substance and Sexual Activity   Alcohol use: No   Drug use: No   Sexual activity: Not Currently    Birth control/protection: None  Other Topics Concern   Not on file  Social History Narrative   Not on file   Social Determinants of Health   Financial Resource Strain: Not on file  Food Insecurity: Not on file  Transportation Needs: Not on file  Physical Activity: Not on file  Stress: Not on file  Social Connections: Not on file  Intimate Partner Violence: Not on file    Family History  Problem Relation Age of Onset   Cancer Maternal Grandfather     No Known Allergies  Current Outpatient Medications on File Prior to Visit  Medication Sig Dispense Refill   cyclobenzaprine (FLEXERIL) 10 MG tablet Take 1 tablet (10 mg total) by mouth every 8 (eight) hours as needed for muscle spasms. 30  tablet 2   metoprolol tartrate (LOPRESSOR) 25 MG tablet Take 1 tablet (25 mg total) by mouth daily. 30 tablet 3   naproxen (NAPROSYN) 500 MG tablet Take 1 tablet (500 mg total) by mouth 2 (two) times daily with a meal. 30 tablet 1   SUMAtriptan (IMITREX) 100 MG tablet Take 1 tablet (100 mg total) by mouth once as needed for up to 1 dose for migraine. May repeat in 2 hours if headache persists or recurs. 9 tablet 3   ferrous sulfate 325 (65 FE) MG tablet Take 1 tablet (325 mg total) by mouth 2 (two) times daily with a meal. (Patient taking differently: Take 325 mg by mouth daily with breakfast. ) 30 tablet 3   HYDROcodone-acetaminophen (NORCO/VICODIN) 5-325 MG tablet Take 1 tablet by mouth every 6 (six) hours as needed for moderate pain. (Patient not taking: Reported on 11/20/2018) 20 tablet 0   ibuprofen (ADVIL,MOTRIN) 800 MG tablet Take 1 tablet (800 mg total) by mouth every 8 (eight) hours as needed. 30 tablet 0   norethindrone-ethinyl estradiol-iron (ESTROSTEP FE,TILIA FE,TRI-LEGEST FE) 1-20/1-30/1-35 MG-MCG tablet Take 1 tablet by mouth daily. (Patient not taking: Reported on 06/23/2022)     No current facility-administered medications on file prior to visit.     Review of Systems:  All pertinent positive/negative included in HPI, all other review of systems are negative   Objective:  Physical Exam BP 106/71   Pulse 69   Ht 5' (1.524 m)   Wt 111 lb (50.3 kg)   BMI 21.68 kg/m  CONSTITUTIONAL: Well-developed, well-nourished female in no acute distress.  EYES: EOM intact ENT: Normocephalic CARDIOVASCULAR: Regular rate  RESPIRATORY: Normal rate.  MUSCULOSKELETAL: Normal ROM SKIN: Warm, dry without erythema  NEUROLOGICAL: Alert, oriented, CN II-XII grossly intact, Appropriate balance PSYCH: Normal behavior, mood   MRI results reviewed with patient:  Narrative & Impression  CLINICAL DATA:  Provided history: Intractable chronic migraine without aura and without status migrainosus.  Headache, chronic, new features or increased frequency.   EXAM: MRI HEAD WITHOUT AND WITH CONTRAST   TECHNIQUE: Multiplanar, multiecho pulse sequences of the brain and surrounding structures were obtained without and with intravenous contrast.   CONTRAST:  5 mL Vueway intravenous contrast.   COMPARISON:  Head CT 02/22/2011.   FINDINGS: Brain:   Cerebral volume is normal.   Cerebellar tonsillar ectopia. The cerebellar tonsils extend up to 6 mm below the level of the foramen magnum (for instance as seen on series 2, image 12).   No cortical encephalomalacia is identified. No significant cerebral white matter disease.   There is no acute infarct.   No evidence of an intracranial mass.   No chronic intracranial blood products.   No extra-axial fluid collection.   No midline shift.   No pathologic intracranial enhancement identified.   Vascular: Maintained flow voids within the proximal large arterial vessels.   Skull and upper cervical spine: No focal suspicious marrow lesion   Sinuses/Orbits: No mass or acute finding within the imaged orbits. No significant paranasal sinus disease.   IMPRESSION: No evidence of acute intracranial abnormality.   The cerebellar tonsils extend up to 6 mm below the level of the foramen magnum. 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 However, the cerebellar tonsils maintain a fairly rounded morphology and cerebellar tonsillar ectopia can alternatively be seen in the setting of intracranial hypotension or idiopathic intracranial hypertension (pseudotumor cerebri).   Otherwise unremarkable MRI appearance of the brain.     Electronically Signed   By: Kellie Simmering D.O.   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   Follow-up PRN  Paticia Stack, PA-C 09/08/2022 9:16 AM

## 2022-09-26 DIAGNOSIS — Z6821 Body mass index (BMI) 21.0-21.9, adult: Secondary | ICD-10-CM | POA: Diagnosis not present

## 2022-09-26 DIAGNOSIS — Z0000 Encounter for general adult medical examination without abnormal findings: Secondary | ICD-10-CM | POA: Diagnosis not present

## 2022-09-26 DIAGNOSIS — G43709 Chronic migraine without aura, not intractable, without status migrainosus: Secondary | ICD-10-CM | POA: Diagnosis not present

## 2022-09-26 DIAGNOSIS — Z13220 Encounter for screening for lipoid disorders: Secondary | ICD-10-CM | POA: Diagnosis not present

## 2022-12-03 ENCOUNTER — Telehealth: Payer: BC Managed Care – PPO | Admitting: Family

## 2022-12-03 DIAGNOSIS — J019 Acute sinusitis, unspecified: Secondary | ICD-10-CM

## 2022-12-03 MED ORDER — AMOXICILLIN-POT CLAVULANATE 875-125 MG PO TABS: 1.0000 | ORAL_TABLET | Freq: Two times a day (BID) | ORAL | 0 refills | Status: DC

## 2022-12-03 NOTE — Progress Notes (Signed)

## 2024-10-16 ENCOUNTER — Telehealth: Payer: Self-pay | Admitting: Physician Assistant

## 2024-10-16 DIAGNOSIS — J019 Acute sinusitis, unspecified: Secondary | ICD-10-CM | POA: Diagnosis not present

## 2024-10-16 MED ORDER — LEVOCETIRIZINE DIHYDROCHLORIDE 5 MG PO TABS: 5.0000 mg | ORAL_TABLET | Freq: Every evening | ORAL | 0 refills | Status: AC

## 2024-10-16 MED ORDER — AMOXICILLIN-POT CLAVULANATE 875-125 MG PO TABS: 1.0000 | ORAL_TABLET | Freq: Two times a day (BID) | ORAL | 0 refills | Status: AC

## 2024-10-16 MED ORDER — FLUTICASONE PROPIONATE 50 MCG/ACT NA SUSP: 2.0000 | Freq: Every day | NASAL | 6 refills | Status: AC

## 2024-10-16 NOTE — Progress Notes (Signed)
 E-Visit for Sinus Problems  We are sorry that you are not feeling well.  Here is how we plan to help!  Based on what you have shared with me it looks like you have sinusitis.  Sinusitis is inflammation and infection in the sinus cavities of the head.  Based on your presentation I believe you most likely have Acute Bacterial Sinusitis.  This is an infection caused by bacteria and is treated with antibiotics. I have prescribed Augmentin  875mg /125mg  one tablet twice daily with food, for 7 days. You may use an oral decongestant such as Mucinex D or if you have glaucoma or high blood pressure use plain Mucinex. Saline nasal spray help and can safely be used as often as needed for congestion.  If you develop worsening sinus pain, fever or notice severe headache and vision changes, or if symptoms are not better after completion of antibiotic, please schedule an appointment with a health care provider.    Sinus infections are not as easily transmitted as other respiratory infection, however we still recommend that you avoid close contact with loved ones, especially the very young and elderly.  Remember to wash your hands thoroughly throughout the day as this is the number one way to prevent the spread of infection!  Home Care: Only take medications as instructed by your medical team. Complete the entire course of an antibiotic. Do not take these medications with alcohol. A steam or ultrasonic humidifier can help congestion.  You can place a towel over your head and breathe in the steam from hot water coming from a faucet. Avoid close contacts especially the very young and the elderly. Cover your mouth when you cough or sneeze. Always remember to wash your hands.  Get Help Right Away If: You develop worsening fever or sinus pain. You develop a severe head ache or visual changes. Your symptoms persist after you have completed your treatment plan.  Make sure you Understand these instructions. Will  watch your condition. Will get help right away if you are not doing well or get worse.  Your e-visit answers were reviewed by a board certified advanced clinical practitioner to complete your personal care plan.  Depending on the condition, your plan could have included both over the counter or prescription medications.  If there is a problem please reply  once you have received a response from your provider.  Your safety is important to us .  If you have drug allergies check your prescription carefully.    You can use MyChart to ask questions about today's visit, request a non-urgent call back, or ask for a work or school excuse for 24 hours related to this e-Visit. If it has been greater than 24 hours you will need to follow up with your provider, or enter a new e-Visit to address those concerns.  You will get an e-mail in the next two days asking about your experience.  I hope that your e-visit has been valuable and will speed your recovery. Thank you for using e-visits.  I have spent 5 minutes in review of e-visit questionnaire, review and updating patient chart, medical decision making and response to patient.   Teena Shuck, PA-C
